# Patient Record
Sex: Female | Born: 1990 | Race: Black or African American | Hispanic: No | Marital: Single | Smoking: Former smoker
Health system: Southern US, Community
[De-identification: ages and names within clinical notes are randomized; demographics above are authoritative.]

## PROBLEM LIST (undated history)

## (undated) ENCOUNTER — Inpatient Hospital Stay: Payer: Self-pay

## (undated) DIAGNOSIS — F32A Depression, unspecified: Secondary | ICD-10-CM

## (undated) DIAGNOSIS — F329 Major depressive disorder, single episode, unspecified: Secondary | ICD-10-CM

---

## 2017-09-15 ENCOUNTER — Other Ambulatory Visit: Payer: Self-pay | Admitting: Family Medicine

## 2017-09-15 DIAGNOSIS — Z3490 Encounter for supervision of normal pregnancy, unspecified, unspecified trimester: Secondary | ICD-10-CM

## 2017-09-21 ENCOUNTER — Observation Stay
Admission: EM | Admit: 2017-09-21 | Discharge: 2017-09-21 | Disposition: A | Discharge status: Home / Self Care | Payer: Medicaid Other | Attending: Obstetrics and Gynecology | Admitting: Obstetrics and Gynecology

## 2017-09-21 DIAGNOSIS — Z3A27 27 weeks gestation of pregnancy: Secondary | ICD-10-CM | POA: Diagnosis not present

## 2017-09-21 DIAGNOSIS — Z87891 Personal history of nicotine dependence: Secondary | ICD-10-CM | POA: Insufficient documentation

## 2017-09-21 DIAGNOSIS — Z3490 Encounter for supervision of normal pregnancy, unspecified, unspecified trimester: Secondary | ICD-10-CM

## 2017-09-21 DIAGNOSIS — R109 Unspecified abdominal pain: Secondary | ICD-10-CM | POA: Diagnosis not present

## 2017-09-21 DIAGNOSIS — O26892 Other specified pregnancy related conditions, second trimester: Principal | ICD-10-CM | POA: Insufficient documentation

## 2017-09-21 LAB — CBC
HEMATOCRIT: 34.2 % — AB (ref 35.0–47.0)
HEMOGLOBIN: 11.7 g/dL — AB (ref 12.0–16.0)
MCH: 29.1 pg (ref 26.0–34.0)
MCHC: 34.3 g/dL (ref 32.0–36.0)
MCV: 84.8 fL (ref 80.0–100.0)
Platelets: 298 10*3/uL (ref 150–440)
RBC: 4.03 MIL/uL (ref 3.80–5.20)
RDW: 13.1 % (ref 11.5–14.5)
WBC: 10.2 10*3/uL (ref 3.6–11.0)

## 2017-09-21 LAB — COMPREHENSIVE METABOLIC PANEL
ALBUMIN: 3.2 g/dL — AB (ref 3.5–5.0)
ALK PHOS: 55 U/L (ref 38–126)
ALT: 12 U/L — AB (ref 14–54)
AST: 17 U/L (ref 15–41)
Anion gap: 8 (ref 5–15)
BILIRUBIN TOTAL: 0.9 mg/dL (ref 0.3–1.2)
BUN: 8 mg/dL (ref 6–20)
CO2: 24 mmol/L (ref 22–32)
CREATININE: 0.59 mg/dL (ref 0.44–1.00)
Calcium: 9 mg/dL (ref 8.9–10.3)
Chloride: 104 mmol/L (ref 101–111)
GFR calc Af Amer: >60 mL/min (ref >60)
GFR calc non Af Amer: >60 mL/min (ref >60)
GLUCOSE: 59 mg/dL — AB (ref 65–99)
POTASSIUM: 3.8 mmol/L (ref 3.5–5.1)
Sodium: 136 mmol/L (ref 135–145)
TOTAL PROTEIN: 7.1 g/dL (ref 6.5–8.1)

## 2017-09-21 MED ORDER — ACETAMINOPHEN 500 MG PO TABS: 500.0000 mg | ORAL_TABLET | Freq: Four times a day (QID) | ORAL | Status: DC | PRN

## 2017-09-21 MED ORDER — ACETAMINOPHEN 500 MG PO TABS
ORAL_TABLET | ORAL | Status: AC
  Administered 2017-09-21: 1000 mg
  Filled 2017-09-21: qty 2

## 2017-09-21 NOTE — OB Triage Note (Signed)
Patient came in for observation for lower abdominal pain. Evaluation. Patient denies uterine contractions at this time. Patient denies leaking, denies vaginal bleeding and spotting. Patient reports + FM. Patient reports Vital signs stable and patient afebrile. FHR baseline 155 with moderate variability with accelerations 15 x 15 and no decelerations. Significant other at bedside. Will continue to monitor.

## 2017-09-21 NOTE — Discharge Summary (Signed)
Patient discharged with instructions on follow up appointment, labor precautions, Tylenol for pain management, and when to seek medical attention. Patient reports + FM at discharge. Patient ambulatory at discharge with steady gait. Patient discharged with significant other. Patient discharged in wheelchair to visitor entrance.

## 2017-09-21 NOTE — Discharge Summary (Signed)
Expand All Collapse All       [] Hide copied text  [] Hover for details   Patient ID: Monica Combs, female   DOB: 01/03/1991, 26 y.o.   MRN: [id number]  Subjective   Monica Combs is a 26 y.o. female. She is at 3573w6d gestation. Patient's last menstrual period was 03/10/2017. Estimated Date of Delivery: 12/15/17  [facility] care [facility] Chief complaint:abd pain for the last 5 days  Constant . No N/V D or fever . No LOF or vag bleeding . S/P LTCS for fetal hrt abnormality .  Poor prenatal care with [facility] . Poor nutrition .   Location: Onset/timing: Duration: Quality:  Severity: Aggravating or alleviating conditions: Associated signs/symptoms: Context:  + good fetal movt   Maternal Medical History:  History reviewed. No pertinent past medical history.  PSHX : prior c/s  Not on File  Prior to Admission medications   Not on File     Social History: She  reports that she has quit smoking. she has never used smokeless tobacco. She reports that she does not drink alcohol or use drugs.  Family History: family history is not on file.  no history of gyn cancers  Review of Systems: A full review of systems was performed and negative except as noted in the HPI.   cv : no chest pain   Pulm : no SOB   ABD : no dirrhea , bloating + abd pain  Gyn : no pelvic pain  M/S no weakness O:   Objective   BP (!) 106/55 (BP Location: Left Arm)   Pulse 78   Temp 97.6 F (36.4 C) (Oral)   Resp 18   Ht 5\' 2"  (1.575 m)   Wt 59 kg (130 lb)   LMP 03/10/2017   BMI 23.78 kg/m       Results for orders placed or performed during the hospital encounter of 09/21/17 (from the past 48 hour(s))  CBC   Collection Time: 09/21/17  8:54 PM  Result Value Ref Range   WBC 10.2 3.6 - 11.0 K/uL   RBC 4.03 3.80 - 5.20 MIL/uL   Hemoglobin 11.7 (L) 12.0 - 16.0 g/dL   HCT 40.934.2 (L) 81.135.0 - 91.447.0 %   MCV 84.8 80.0 - 100.0 fL   MCH 29.1 26.0 - 34.0 pg    MCHC 34.3 32.0 - 36.0 g/dL   RDW 78.213.1 95.611.5 - 21.314.5 %   Platelets 298 150 - 440 K/uL  Comprehensive metabolic panel   Collection Time: 09/21/17  8:54 PM  Result Value Ref Range   Sodium 136 135 - 145 mmol/L   Potassium 3.8 3.5 - 5.1 mmol/L   Chloride 104 101 - 111 mmol/L   CO2 24 22 - 32 mmol/L   Glucose, Bld 59 (L) 65 - 99 mg/dL   BUN 8 6 - 20 mg/dL   Creatinine, Ser 0.860.59 0.44 - 1.00 mg/dL   Calcium 9.0 8.9 - 57.810.3 mg/dL   Total Protein 7.1 6.5 - 8.1 g/dL   Albumin 3.2 (L) 3.5 - 5.0 g/dL   AST 17 15 - 41 U/L   ALT 12 (L) 14 - 54 U/L   Alkaline Phosphatase 55 38 - 126 U/L   Total Bilirubin 0.9 0.3 - 1.2 mg/dL   GFR calc non Af Amer >60 >60 mL/min   GFR calc Af Amer >60 >60 mL/min   Anion gap 8 5 - 15     Constitutional: NAD, AAOx3  HE/ENT: extraocular movements grossly intact, moist mucous membranes CV: RRR PULM: nl respiratory effort, CTABL                                         Abd: gravid, non-tender, non-distended, soft . No rebound TTP                                            Ext: Non-tender, Nonedmeatous                     Psych: mood appropriate, speech normal Pelviccx long and closed . No blood  NST:  Baseline: 150 Variability: moderate Accelerations present x >2 10x10 Decelerations absent Time 90 min      A/P: 26 y.o. 7267w6d here for antenatal surveillance for abdominal pain . Benign abd exam Labor: not present.    poor PNC   Poor nutrition   Fetal Wellbeing: Reassuring Cat 1 tracing.  Reactive NST   D/c home stable, precautions reviewed, follow-up as scheduled.    fetal kick count instructions given   ----- Monica Combs Monica Combs , MD Attending Obstetrician and Gynecologist [facility], Department of OB/GYN [facility]           Electronically signed by Neidy Guerrieri, Ihor Austinhomas J, MD at 09/21/2017 10:37 PM     ED to Hosp-Admission (Current) on 09/21/2017        Detailed Report

## 2017-09-21 NOTE — Progress Notes (Signed)
Patient ID: Monica Combs, female   DOB: 1991/04/27, 26 y.o.   MRN: [id number]  Monica Combs is a 26 y.o. female. She is at 8334w6d gestation. Patient's last menstrual period was 03/10/2017. Estimated Date of Delivery: 12/15/17  [facility] Chief complaint:abd pain for the last 5 days  Constant . No N/V D or fever . No LOF or vag bleeding . S/P LTCS for fetal hrt abnormality .  Poor prenatal care with [facility] . Poor nutrition .   Location: Onset/timing: Duration: Quality:  Severity: Aggravating or alleviating conditions: Associated signs/symptoms: Context:  + good fetal movt   Maternal Medical History:  History reviewed. No pertinent past medical history.  PSHX : prior c/s  Not on File  Prior to Admission medications   Not on File     Social History: She  reports that she has quit smoking. she has never used smokeless tobacco. She reports that she does not drink alcohol or use drugs.  Family History: family history is not on file.  no history of gyn cancers  Review of Systems: A full review of systems was performed and negative except as noted in the HPI.   cv : no chest pain   Pulm : no SOB   ABD : no dirrhea , bloating + abd pain  Gyn : no pelvic pain  M/S no weakness O:  BP (!) 106/55 (BP Location: Left Arm)   Pulse 78   Temp 97.6 F (36.4 C) (Oral)   Resp 18   Ht 5\' 2"  (1.575 m)   Wt 59 kg (130 lb)   LMP 03/10/2017   BMI 23.78 kg/m  Results for orders placed or performed during the hospital encounter of 09/21/17 (from the past 48 hour(s))  CBC   Collection Time: 09/21/17  8:54 PM  Result Value Ref Range   WBC 10.2 3.6 - 11.0 K/uL   RBC 4.03 3.80 - 5.20 MIL/uL   Hemoglobin 11.7 (L) 12.0 - 16.0 g/dL   HCT 91.434.2 (L) 78.235.0 - 95.647.0 %   MCV 84.8 80.0 - 100.0 fL   MCH 29.1 26.0 - 34.0 pg   MCHC 34.3 32.0 - 36.0 g/dL   RDW 21.313.1 08.611.5 - 57.814.5 %   Platelets 298 150 - 440 K/uL  Comprehensive metabolic panel   Collection Time:  09/21/17  8:54 PM  Result Value Ref Range   Sodium 136 135 - 145 mmol/L   Potassium 3.8 3.5 - 5.1 mmol/L   Chloride 104 101 - 111 mmol/L   CO2 24 22 - 32 mmol/L   Glucose, Bld 59 (L) 65 - 99 mg/dL   BUN 8 6 - 20 mg/dL   Creatinine, Ser 4.690.59 0.44 - 1.00 mg/dL   Calcium 9.0 8.9 - 62.910.3 mg/dL   Total Protein 7.1 6.5 - 8.1 g/dL   Albumin 3.2 (L) 3.5 - 5.0 g/dL   AST 17 15 - 41 U/L   ALT 12 (L) 14 - 54 U/L   Alkaline Phosphatase 55 38 - 126 U/L   Total Bilirubin 0.9 0.3 - 1.2 mg/dL   GFR calc non Af Amer >60 >60 mL/min   GFR calc Af Amer >60 >60 mL/min   Anion gap 8 5 - 15     Constitutional: NAD, AAOx3  HE/ENT: extraocular movements grossly intact, moist mucous membranes CV: RRR PULM: nl respiratory effort, CTABL     Abd: gravid, non-tender, non-distended, soft . No rebound TTP  Ext: Non-tender, Nonedmeatous   Psych: mood appropriate, speech normal Pelviccx long and closed . No blood  NST:  Baseline: 150 Variability: moderate Accelerations present x >2 10x10 Decelerations absent Time 90 min      A/P: 26 y.o. 125w6d here for antenatal surveillance for abdominal pain . Benign abd exam Labor: not present.    poor PNC   Poor nutrition   Fetal Wellbeing: Reassuring Cat 1 tracing.  Reactive NST   D/c home stable, precautions reviewed, follow-up as scheduled.    fetal kick count instructions given   ----- Jennell Cornerhomas Schermerhorn , MD Attending Obstetrician and Gynecologist [facility], Department of OB/GYN [facility]

## 2017-09-22 ENCOUNTER — Encounter: Admission: RE | Admit: 2017-09-22 | Payer: Medicaid Other | Source: Ambulatory Visit

## 2017-09-22 LAB — URINE DRUG SCREEN, QUALITATIVE (ARMC ONLY)
AMPHETAMINES, UR SCREEN: NOT DETECTED
BARBITURATES, UR SCREEN: NOT DETECTED
Benzodiazepine, Ur Scrn: NOT DETECTED
CANNABINOID 50 NG, UR ~~LOC~~: POSITIVE — AB
Cocaine Metabolite,Ur ~~LOC~~: NOT DETECTED
MDMA (Ecstasy)Ur Screen: NOT DETECTED
Methadone Scn, Ur: NOT DETECTED
OPIATE, UR SCREEN: NOT DETECTED
PHENCYCLIDINE (PCP) UR S: NOT DETECTED
Tricyclic, Ur Screen: NOT DETECTED

## 2017-09-22 LAB — URINALYSIS, COMPLETE (UACMP) WITH MICROSCOPIC
BILIRUBIN URINE: NEGATIVE
Bacteria, UA: NONE SEEN
GLUCOSE, UA: NEGATIVE mg/dL
HGB URINE DIPSTICK: NEGATIVE
KETONES UR: NEGATIVE mg/dL
LEUKOCYTES UA: NEGATIVE
Nitrite: NEGATIVE
PH: 7 (ref 5.0–8.0)
PROTEIN: NEGATIVE mg/dL
RBC / HPF: NONE SEEN RBC/hpf (ref 0–5)
Specific Gravity, Urine: 1.014 (ref 1.005–1.030)

## 2017-09-23 ENCOUNTER — Ambulatory Visit: Payer: Medicaid Other

## 2017-09-24 ENCOUNTER — Ambulatory Visit
Admission: RE | Admit: 2017-09-24 | Discharge: 2017-09-24 | Disposition: A | Discharge status: Home / Self Care | Payer: Medicaid Other | Source: Ambulatory Visit | Attending: Family Medicine | Admitting: Family Medicine

## 2017-09-24 DIAGNOSIS — Z3689 Encounter for other specified antenatal screening: Secondary | ICD-10-CM | POA: Diagnosis present

## 2017-09-24 DIAGNOSIS — Z3490 Encounter for supervision of normal pregnancy, unspecified, unspecified trimester: Secondary | ICD-10-CM

## 2017-10-22 ENCOUNTER — Encounter: Payer: Self-pay | Admitting: *Deleted

## 2017-10-22 ENCOUNTER — Observation Stay
Admission: EM | Admit: 2017-10-22 | Discharge: 2017-10-22 | Disposition: A | Discharge status: Home / Self Care | Payer: Medicaid Other | Attending: Obstetrics & Gynecology | Admitting: Obstetrics & Gynecology

## 2017-10-22 ENCOUNTER — Inpatient Hospital Stay: Payer: Medicaid Other

## 2017-10-22 DIAGNOSIS — R109 Unspecified abdominal pain: Secondary | ICD-10-CM

## 2017-10-22 DIAGNOSIS — O99343 Other mental disorders complicating pregnancy, third trimester: Secondary | ICD-10-CM | POA: Insufficient documentation

## 2017-10-22 DIAGNOSIS — F329 Major depressive disorder, single episode, unspecified: Secondary | ICD-10-CM | POA: Insufficient documentation

## 2017-10-22 DIAGNOSIS — Z87891 Personal history of nicotine dependence: Secondary | ICD-10-CM | POA: Insufficient documentation

## 2017-10-22 DIAGNOSIS — Z3A32 32 weeks gestation of pregnancy: Secondary | ICD-10-CM | POA: Diagnosis not present

## 2017-10-22 DIAGNOSIS — O26893 Other specified pregnancy related conditions, third trimester: Principal | ICD-10-CM | POA: Insufficient documentation

## 2017-10-22 DIAGNOSIS — Z3490 Encounter for supervision of normal pregnancy, unspecified, unspecified trimester: Secondary | ICD-10-CM

## 2017-10-22 HISTORY — DX: Depression, unspecified: F32.A

## 2017-10-22 HISTORY — DX: Major depressive disorder, single episode, unspecified: F32.9
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
## 2017-10-22 NOTE — Discharge Instructions (Signed)
Follow up with next appt at [facility] on 10/28/17  Return if pain gets worse, if you have leaking of fluid or vaginal bleeding or decreased fetal movement
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
## 2017-10-22 NOTE — OB Triage Note (Signed)
Pt arrived from home with complaints of abdominal pain that is constant for the last 3 days. Pt denies any leaking of fluid or vaginal bleeding. Pt reports positive fetal movement. Pt received PNC at [facility]

## 2017-11-13 ENCOUNTER — Other Ambulatory Visit: Payer: Self-pay

## 2017-11-13 ENCOUNTER — Observation Stay
Admission: EM | Admit: 2017-11-13 | Discharge: 2017-11-13 | Disposition: A | Discharge status: Home / Self Care | Payer: Medicaid Other | Attending: Obstetrics & Gynecology | Admitting: Obstetrics & Gynecology

## 2017-11-13 DIAGNOSIS — R112 Nausea with vomiting, unspecified: Secondary | ICD-10-CM | POA: Diagnosis present

## 2017-11-13 DIAGNOSIS — Z79899 Other long term (current) drug therapy: Secondary | ICD-10-CM | POA: Insufficient documentation

## 2017-11-13 DIAGNOSIS — F329 Major depressive disorder, single episode, unspecified: Secondary | ICD-10-CM | POA: Diagnosis not present

## 2017-11-13 DIAGNOSIS — R1110 Vomiting, unspecified: Secondary | ICD-10-CM | POA: Diagnosis present

## 2017-11-13 DIAGNOSIS — Z87891 Personal history of nicotine dependence: Secondary | ICD-10-CM | POA: Insufficient documentation

## 2017-11-13 MED ORDER — LACTATED RINGERS IV SOLN
INTRAVENOUS | Status: DC
  Administered 2017-11-13: 05:00:00 via INTRAVENOUS
  Administered 2017-11-13: 1000 mL via INTRAVENOUS

## 2017-11-13 MED ORDER — METOCLOPRAMIDE HCL 5 MG/ML IJ SOLN
10.0000 mg | INTRAMUSCULAR | Status: DC | PRN
  Filled 2017-11-13: qty 2

## 2017-11-13 MED ORDER — LACTATED RINGERS IV BOLUS (SEPSIS)
500.0000 mL | Freq: Once | INTRAVENOUS | Status: AC
  Administered 2017-11-13: 500 mL via INTRAVENOUS

## 2017-11-13 MED ORDER — PROMETHAZINE HCL 25 MG/ML IJ SOLN
12.5000 mg | Freq: Four times a day (QID) | INTRAMUSCULAR | Status: DC | PRN
  Administered 2017-11-13: 12.5 mg via INTRAVENOUS

## 2017-11-13 MED ORDER — ONDANSETRON HCL 4 MG/2ML IJ SOLN: 4.0000 mg | Freq: Three times a day (TID) | INTRAMUSCULAR | Status: DC | PRN

## 2017-11-13 MED ORDER — PROMETHAZINE HCL 25 MG/ML IJ SOLN
INTRAMUSCULAR | Status: AC
  Administered 2017-11-13: 12.5 mg via INTRAVENOUS
  Filled 2017-11-13: qty 1

## 2017-11-13 NOTE — Discharge Summary (Signed)
Monica Combs is a 27 y.o. female. She is at 1187w3d gestation. Patient's last menstrual period was 03/10/2017. Estimated Date of Delivery: 12/15/17  [facility]: [facility]  Chief complaint: persistent nausea and vomiting  Location: GI system Onset/timing: midnight Duration: 3 hours Quality:  Nausea vomiting Severity: moderate Aggravating or alleviating conditions:nothing has made better or worse Associated signs/symptoms: no fever, chills, diarrhea, no VB.no LOF, Active fetal movement. Occasional contractions Context: nausea has been present a little longer but vomiting started at midnight and could not stop.  She noted some blood, and occasional contractions.  No known sick contacts, no diarrhea.  Had a preterm cesarean delivery last @ 36wks.    Maternal Medical History:   Past Medical History:  Diagnosis Date  . Depression    hx of depression    Past Surgical History:  Procedure Laterality Date  . CESAREAN SECTION     x1    Allergies  Allergen Reactions  . Other     Polyester - causes hives    Prior to Admission medications   Medication Sig Start Date End Date Taking? Authorizing Provider  Prenatal Vit-Fe Fumarate-FA (PRENATAL MULTIVITAMIN) TABS tablet Take 1 tablet by mouth daily at 12 noon.   Yes [provider]     Social History: She  reports that she has quit smoking. she has never used smokeless tobacco. She reports that she does not drink alcohol or use drugs.  Family History: no history of gyn cancers  Review of Systems: A full review of systems was performed and negative except as noted in the HPI.     O:  BP 105/61   Pulse 78   Temp 98 F (36.7 C) (Oral)   Resp 16   Ht 5\' 3"  (1.6 m)   Wt 68 kg (150 lb)   LMP 03/10/2017   BMI 26.57 kg/m  No results found for this or any previous visit (from the past 48 hour(s)).   Constitutional: NAD, AAOx3  HE/ENT: extraocular movements grossly intact, moist mucous membranes CV: RRR PULM:  nl respiratory effort, CTABL     Abd: gravid, non-tender, non-distended, soft      Ext: Non-tender, Nonedmeatous   Psych: mood appropriate, speech normal Pelvic fingertip, long, high  NST:  Baseline: 140 Variability: moderate Accelerations present x >2 Decelerations absent Time 20mins  Toco 2-4 on arrival, with only irritability at time of discharge   A/P: [age] y.o. 6087w3d here for nausea and vomiting  Labor: not present.   Fetal Wellbeing: Reassuring Cat 1 tracing.  Reactive NST   Phenergan given to patient, after which she slept well, and upon waking she had a breakfast and tolerated it well.  D/c home stable, precautions reviewed, follow-up as scheduled.   ----- Ranae Plumberhelsea Ward, MD Attending Obstetrician and Gynecologist [facility], Department of OB/GYN [facility]

## 2017-11-13 NOTE — Progress Notes (Signed)
Pt returned to bed from restroom, states she ate some cracker and is keeping it down, reports still feeling painful contractions in abdomen.
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
## 2017-12-16 ENCOUNTER — Other Ambulatory Visit: Payer: Self-pay

## 2017-12-16 ENCOUNTER — Observation Stay
Admission: EM | Admit: 2017-12-16 | Discharge: 2017-12-16 | Disposition: A | Discharge status: Home / Self Care | Payer: Medicaid Other | Attending: Obstetrics and Gynecology | Admitting: Obstetrics and Gynecology

## 2017-12-16 DIAGNOSIS — O9989 Other specified diseases and conditions complicating pregnancy, childbirth and the puerperium: Secondary | ICD-10-CM | POA: Diagnosis not present

## 2017-12-16 DIAGNOSIS — R110 Nausea: Secondary | ICD-10-CM | POA: Insufficient documentation

## 2017-12-16 DIAGNOSIS — O368190 Decreased fetal movements, unspecified trimester, not applicable or unspecified: Secondary | ICD-10-CM | POA: Diagnosis present

## 2017-12-16 DIAGNOSIS — O368130 Decreased fetal movements, third trimester, not applicable or unspecified: Principal | ICD-10-CM | POA: Insufficient documentation

## 2017-12-16 DIAGNOSIS — Z3A36 36 weeks gestation of pregnancy: Secondary | ICD-10-CM | POA: Insufficient documentation

## 2017-12-16 LAB — URINE DRUG SCREEN, QUALITATIVE (ARMC ONLY)
Amphetamines, Ur Screen: NOT DETECTED
Barbiturates, Ur Screen: NOT DETECTED
Benzodiazepine, Ur Scrn: NOT DETECTED
COCAINE METABOLITE, UR ~~LOC~~: NOT DETECTED
Cannabinoid 50 Ng, Ur ~~LOC~~: NOT DETECTED
MDMA (Ecstasy)Ur Screen: NOT DETECTED
METHADONE SCREEN, URINE: NOT DETECTED
OPIATE, UR SCREEN: NOT DETECTED
Phencyclidine (PCP) Ur S: NOT DETECTED
Tricyclic, Ur Screen: NOT DETECTED

## 2017-12-16 LAB — URINALYSIS, ROUTINE W REFLEX MICROSCOPIC
Bilirubin Urine: NEGATIVE
Glucose, UA: NEGATIVE mg/dL
HGB URINE DIPSTICK: NEGATIVE
Ketones, ur: NEGATIVE mg/dL
Leukocytes, UA: NEGATIVE
Nitrite: NEGATIVE
PROTEIN: NEGATIVE mg/dL
Specific Gravity, Urine: 1.008 (ref 1.005–1.030)
pH: 6 (ref 5.0–8.0)

## 2017-12-16 MED ORDER — ONDANSETRON 4 MG PO TBDP
ORAL_TABLET | ORAL | Status: AC
  Administered 2017-12-16: 4 mg
  Filled 2017-12-16: qty 1

## 2017-12-16 MED ORDER — ONDANSETRON 4 MG PO TBDP: 4.0000 mg | ORAL_TABLET | Freq: Once | ORAL | Status: DC

## 2017-12-16 NOTE — OB Triage Note (Signed)
Pt c/o vomiting and nausea since last night, and decreased fetal movement. Last time she threw up was last night.

## 2017-12-16 NOTE — Discharge Summary (Signed)
Patient ID: Monica LaityLatia Combs MRN: [id number] DOB/AGE: 1991-03-22 26 y.o.  Admit date: 12/16/2017 Discharge date: 12/16/2017  Admission Diagnoses: decreased fetal movement and nausea  Discharge Diagnoses: reassuring fetal status  Prenatal Procedures: NST  Consults: attending MD- Dr Feliberto GottronSchermerhorn  Significant Diagnostic Studies:  Results for orders placed or performed during the hospital encounter of 12/16/17 (from the past 168 hour(s))  Urinalysis, Routine w reflex microscopic   Collection Time: 12/16/17  1:52 PM  Result Value Ref Range   Color, Urine YELLOW (A) YELLOW   APPearance HAZY (A) CLEAR   Specific Gravity, Urine 1.008 1.005 - 1.030   pH 6.0 5.0 - 8.0   Glucose, UA NEGATIVE NEGATIVE mg/dL   Hgb urine dipstick NEGATIVE NEGATIVE   Bilirubin Urine NEGATIVE NEGATIVE   Ketones, ur NEGATIVE NEGATIVE mg/dL   Protein, ur NEGATIVE NEGATIVE mg/dL   Nitrite NEGATIVE NEGATIVE   Leukocytes, UA NEGATIVE NEGATIVE  Urine Drug Screen, Qualitative ([facility] only)   Collection Time: 12/16/17  1:52 PM  Result Value Ref Range   Tricyclic, Ur Screen NONE DETECTED NONE DETECTED   Amphetamines, Ur Screen NONE DETECTED NONE DETECTED   MDMA (Ecstasy)Ur Screen NONE DETECTED NONE DETECTED   Cocaine Metabolite,Ur [place] NONE DETECTED NONE DETECTED   Opiate, Ur Screen NONE DETECTED NONE DETECTED   Phencyclidine (PCP) Ur S NONE DETECTED NONE DETECTED   Cannabinoid 50 Ng, Ur [place] NONE DETECTED NONE DETECTED   Barbiturates, Ur Screen NONE DETECTED NONE DETECTED   Benzodiazepine, Ur Scrn NONE DETECTED NONE DETECTED   Methadone Scn, Ur NONE DETECTED NONE DETECTED    Treatments: Zofran ODT given, PO hydration.   Hospital Course:  This is a 27 y.o. G2P1001 with IUP at 5952w2d by 24wk US, Presented via EMS with c/o pelvic pressure, nausea and decreased fetal movement. On arrival she was noted to have a cervical exam of Dilation: 1 Effacement (%): 30 Cervical Position: Posterior Station: -3 Exam by:: L Neese,RN   No leaking of fluid and no bleeding.  NST reactive, irreg UCs q8-5720min.  She was observed, fetal heart rate monitoring remained reassuring, and she had no signs/symptoms of preterm labor or other maternal-fetal concerns. She was deemed stable for discharge to home with [facility] as soon as possible to discuss delivery planning.   Discharge Physical Exam:  BP 116/70 (BP Location: Left Arm)   Pulse 88   Temp 98.4 F (36.9 C) (Oral)   Resp 18   LMP 03/10/2017   General: NAD CV: RRR Pulm: CTABL, nl effort ABD: s/nd/nt, gravid DVT Evaluation: LE non-ttp, no evidence of DVT on exam.  SVE: 1/30/-2 FHT: 135bpm, + accels, mod variability, no decels.  TOCO: irregular UCs   Discharge Condition: Stable  Disposition: 01-Home or Self Care  Discharge Instructions    Fetal Kick Count:  Lie on our left side for one hour after a meal, and count the number of times your baby kicks.  If it is less than 5 times, get up, move around and drink some juice.  Repeat the test 30 minutes later.  If it is still less than 5 kicks in an hour, notify your doctor.   Complete by:  As directed    LABOR:  When conractions begin, you should start to time them from the beginning of one contraction to the beginning  of the next.  When contractions are 5 - 10 minutes apart or less and have been regular for at least an hour, you should call  your health care provider.   Complete by:  As directed    Notify physician for bleeding from the vagina   Complete by:  As directed    Notify physician for blurring of vision or spots before the eyes   Complete by:  As directed    Notify physician for chills or fever   Complete by:  As directed    Notify physician for fainting spells, "black outs" or loss of consciousness   Complete by:  As directed    Notify physician for increase in vaginal discharge   Complete by:  As directed    Notify physician for leaking of fluid   Complete by:  As  directed    Notify physician for pain or burning when urinating   Complete by:  As directed    Notify physician for pelvic pressure (sudden increase)   Complete by:  As directed    Notify physician for severe or continued nausea or vomiting   Complete by:  As directed    Notify physician for sudden gushing of fluid from the vagina (with or without continued leaking)   Complete by:  As directed    Notify physician for sudden, constant, or occasional abdominal pain   Complete by:  As directed    Notify physician if baby moving less than usual   Complete by:  As directed      Allergies as of 12/16/2017      Reactions   Other    Polyester - causes hives      Medication List    TAKE these medications   prenatal multivitamin Tabs tablet Take 1 tablet by mouth daily at 12 noon.      Follow-up Information    Center, [facility]. Go on 12/20/2017.   Specialty:  General Practice Why:  at 3:40pm Contact information: [address] [place] [address] [phone]           Signed: ----- McVey, REBECCA A, CNM 12/16/17 3:55 PM

## 2017-12-28 ENCOUNTER — Inpatient Hospital Stay
Admission: EM | Admit: 2017-12-28 | Discharge: 2017-12-31 | DRG: 788 | Disposition: A | Discharge status: Home / Self Care | Payer: Medicaid Other | Source: Ambulatory Visit | Attending: Obstetrics and Gynecology | Admitting: Obstetrics and Gynecology

## 2017-12-28 DIAGNOSIS — Z87891 Personal history of nicotine dependence: Secondary | ICD-10-CM

## 2017-12-28 DIAGNOSIS — O479 False labor, unspecified: Secondary | ICD-10-CM | POA: Diagnosis present

## 2017-12-28 DIAGNOSIS — Z9889 Other specified postprocedural states: Secondary | ICD-10-CM

## 2017-12-28 DIAGNOSIS — Z23 Encounter for immunization: Secondary | ICD-10-CM

## 2017-12-28 DIAGNOSIS — Z91040 Latex allergy status: Secondary | ICD-10-CM

## 2017-12-28 DIAGNOSIS — O99824 Streptococcus B carrier state complicating childbirth: Secondary | ICD-10-CM | POA: Diagnosis present

## 2017-12-28 DIAGNOSIS — O34211 Maternal care for low transverse scar from previous cesarean delivery: Principal | ICD-10-CM | POA: Diagnosis present

## 2017-12-28 DIAGNOSIS — Z3A38 38 weeks gestation of pregnancy: Secondary | ICD-10-CM

## 2017-12-28 NOTE — H&P (Signed)
Monica Combs is a 27 y.o. female [facility] . Pt elects for repeat LTCS  On 01/05/18.   Pregnancy issues:  Depression and h/o bipolar - no meds  + GBS . OB History    Gravida Para Term Preterm AB Living   2 1 1     1    SAB TAB Ectopic Multiple Live Births           1     Past Medical History:  Diagnosis Date  . Depression    hx of depression   Past Surgical History:  Procedure Laterality Date  . CESAREAN SECTION     x1   Family History: family history is not on file. Social History:  reports that she has quit smoking. she has never used smokeless tobacco. She reports that she does not drink alcohol or use drugs.     Maternal Diabetes: No Genetic Screening: not done Maternal Ultrasounds/Referrals: Normal Fetal Ultrasounds or other Referrals:  None Maternal Substance Abuse:  No Significant Maternal Medications:  None Significant Maternal Lab Results:  None Other Comments:  None  ROSunremarkable   History  PMHX : depression and bipolar  PSHX : cesarean section  Last menstrual period 04/06/2017. Exam : BP 109/79   wt #164 Lungs CTA   CV RRR without murmur   Adb : gravid NT  Pelvic deferred    Prenatal labs: ABO, Rh:  B+ Antibody:  neg  Rubella:  IMM/ varicella IMM RPR:   NR  HBsAg:   neg  HIV:   Neg  GBS:   Positive   Assessment/Plan: Elective repeat LTCS 01/05/18 . Risks of the procedure d/w the pt   Suzy Bouchardhomas J Yehudit Fulginiti 12/28/2017, 3:48 PM

## 2017-12-29 ENCOUNTER — Encounter
Admission: EM | Disposition: A | Discharge status: Home / Self Care | Payer: Self-pay | Source: Ambulatory Visit | Attending: Obstetrics and Gynecology

## 2017-12-29 ENCOUNTER — Inpatient Hospital Stay: Payer: Medicaid Other | Admitting: Anesthesiology

## 2017-12-29 ENCOUNTER — Other Ambulatory Visit: Payer: Self-pay

## 2017-12-29 DIAGNOSIS — O34211 Maternal care for low transverse scar from previous cesarean delivery: Secondary | ICD-10-CM | POA: Diagnosis present

## 2017-12-29 DIAGNOSIS — Z9889 Other specified postprocedural states: Secondary | ICD-10-CM

## 2017-12-29 DIAGNOSIS — O479 False labor, unspecified: Secondary | ICD-10-CM | POA: Diagnosis present

## 2017-12-29 DIAGNOSIS — O99824 Streptococcus B carrier state complicating childbirth: Secondary | ICD-10-CM | POA: Diagnosis present

## 2017-12-29 DIAGNOSIS — Z87891 Personal history of nicotine dependence: Secondary | ICD-10-CM | POA: Diagnosis not present

## 2017-12-29 DIAGNOSIS — Z3A38 38 weeks gestation of pregnancy: Secondary | ICD-10-CM | POA: Diagnosis not present

## 2017-12-29 DIAGNOSIS — Z23 Encounter for immunization: Secondary | ICD-10-CM | POA: Diagnosis not present

## 2017-12-29 DIAGNOSIS — Z91040 Latex allergy status: Secondary | ICD-10-CM | POA: Diagnosis not present

## 2017-12-29 LAB — CBC
HCT: 36.7 % (ref 35.0–47.0)
HEMATOCRIT: 33.8 % — AB (ref 35.0–47.0)
HEMOGLOBIN: 11.2 g/dL — AB (ref 12.0–16.0)
Hemoglobin: 12.1 g/dL (ref 12.0–16.0)
MCH: 27.7 pg (ref 26.0–34.0)
MCH: 27.8 pg (ref 26.0–34.0)
MCHC: 33 g/dL (ref 32.0–36.0)
MCHC: 33.1 g/dL (ref 32.0–36.0)
MCV: 83.7 fL (ref 80.0–100.0)
MCV: 84.4 fL (ref 80.0–100.0)
PLATELETS: 399 10*3/uL (ref 150–440)
Platelets: 347 10*3/uL (ref 150–440)
RBC: 4.03 MIL/uL (ref 3.80–5.20)
RBC: 4.35 MIL/uL (ref 3.80–5.20)
RDW: 14.8 % — ABNORMAL HIGH (ref 11.5–14.5)
RDW: 14.9 % — ABNORMAL HIGH (ref 11.5–14.5)
WBC: 10.7 10*3/uL (ref 3.6–11.0)
WBC: 14.7 10*3/uL — ABNORMAL HIGH (ref 3.6–11.0)

## 2017-12-29 LAB — TYPE AND SCREEN
ABO/RH(D): B POS
ANTIBODY SCREEN: NEGATIVE

## 2017-12-29 LAB — ABO/RH: ABO/RH(D): B POS

## 2017-12-29 SURGERY — Surgical Case
Anesthesia: Spinal | Site: Abdomen | Wound class: Clean Contaminated

## 2017-12-29 MED ORDER — BUPIVACAINE LIPOSOME 1.3 % IJ SUSP
20.0000 mL | Freq: Once | INTRAMUSCULAR | Status: DC
  Filled 2017-12-29: qty 20

## 2017-12-29 MED ORDER — SIMETHICONE 80 MG PO CHEW
80.0000 mg | CHEWABLE_TABLET | Freq: Three times a day (TID) | ORAL | Status: DC
  Administered 2017-12-29 – 2017-12-31 (×7): 80 mg via ORAL
  Filled 2017-12-29 (×7): qty 1

## 2017-12-29 MED ORDER — BUTORPHANOL TARTRATE 1 MG/ML IJ SOLN: 1.0000 mg | INTRAMUSCULAR | Status: DC | PRN

## 2017-12-29 MED ORDER — KETOROLAC TROMETHAMINE 30 MG/ML IJ SOLN
30.0000 mg | Freq: Four times a day (QID) | INTRAMUSCULAR | Status: AC
  Filled 2017-12-29 (×2): qty 1

## 2017-12-29 MED ORDER — LACTATED RINGERS IV SOLN
INTRAVENOUS | Status: DC
  Administered 2017-12-29: 16:00:00 via INTRAVENOUS

## 2017-12-29 MED ORDER — OXYCODONE-ACETAMINOPHEN 5-325 MG PO TABS: 2.0000 | ORAL_TABLET | ORAL | Status: DC | PRN

## 2017-12-29 MED ORDER — BUPIVACAINE LIPOSOME 1.3 % IJ SUSP
INTRAMUSCULAR | Status: DC | PRN
  Administered 2017-12-29: 88 mL

## 2017-12-29 MED ORDER — ACETAMINOPHEN 325 MG PO TABS
650.0000 mg | ORAL_TABLET | ORAL | Status: DC | PRN
  Administered 2017-12-30 – 2017-12-31 (×4): 650 mg via ORAL
  Filled 2017-12-29 (×4): qty 2

## 2017-12-29 MED ORDER — OXYTOCIN 40 UNITS IN LACTATED RINGERS INFUSION - SIMPLE MED
2.5000 [IU]/h | INTRAVENOUS | Status: AC
  Administered 2017-12-29: 2.5 [IU]/h via INTRAVENOUS

## 2017-12-29 MED ORDER — SIMETHICONE 80 MG PO CHEW: 80.0000 mg | CHEWABLE_TABLET | ORAL | Status: DC | PRN

## 2017-12-29 MED ORDER — LACTATED RINGERS IV SOLN
INTRAVENOUS | Status: DC
  Administered 2017-12-29 (×2): via INTRAVENOUS

## 2017-12-29 MED ORDER — ONDANSETRON HCL 4 MG/2ML IJ SOLN
INTRAMUSCULAR | Status: DC | PRN
  Administered 2017-12-29: 4 mg via INTRAVENOUS

## 2017-12-29 MED ORDER — ZOLPIDEM TARTRATE 5 MG PO TABS: 5.0000 mg | ORAL_TABLET | Freq: Every evening | ORAL | Status: DC | PRN

## 2017-12-29 MED ORDER — SODIUM CHLORIDE 0.9% FLUSH: 3.0000 mL | INTRAVENOUS | Status: DC | PRN

## 2017-12-29 MED ORDER — OXYCODONE HCL 5 MG PO TABS: 10.0000 mg | ORAL_TABLET | ORAL | Status: DC | PRN

## 2017-12-29 MED ORDER — DIBUCAINE 1 % RE OINT: 1.0000 "application " | TOPICAL_OINTMENT | RECTAL | Status: DC | PRN

## 2017-12-29 MED ORDER — ACETAMINOPHEN 325 MG PO TABS
650.0000 mg | ORAL_TABLET | ORAL | Status: DC | PRN
Start: 2017-12-29 — End: 2017-12-29

## 2017-12-29 MED ORDER — MORPHINE SULFATE (PF) 0.5 MG/ML IJ SOLN
INTRAMUSCULAR | Status: AC
  Filled 2017-12-29: qty 10

## 2017-12-29 MED ORDER — MENTHOL 3 MG MT LOZG
1.0000 | LOZENGE | OROMUCOSAL | Status: DC | PRN
  Filled 2017-12-29: qty 9

## 2017-12-29 MED ORDER — FENTANYL CITRATE (PF) 100 MCG/2ML IJ SOLN
INTRAMUSCULAR | Status: DC | PRN
  Administered 2017-12-29: 15 ug via INTRATHECAL

## 2017-12-29 MED ORDER — OXYTOCIN 40 UNITS IN LACTATED RINGERS INFUSION - SIMPLE MED
2.5000 [IU]/h | INTRAVENOUS | Status: DC
  Administered 2017-12-29: 199 mL via INTRAVENOUS
  Administered 2017-12-29: 1 mL via INTRAVENOUS
  Filled 2017-12-29: qty 1000

## 2017-12-29 MED ORDER — ACETAMINOPHEN 325 MG PO TABS
650.0000 mg | ORAL_TABLET | Freq: Four times a day (QID) | ORAL | Status: AC
  Administered 2017-12-29 – 2017-12-30 (×4): 650 mg via ORAL
  Filled 2017-12-29 (×4): qty 2

## 2017-12-29 MED ORDER — MORPHINE SULFATE (PF) 0.5 MG/ML IJ SOLN
INTRAMUSCULAR | Status: DC | PRN
  Administered 2017-12-29: .1 mg via INTRATHECAL

## 2017-12-29 MED ORDER — WITCH HAZEL-GLYCERIN EX PADS: 1.0000 "application " | MEDICATED_PAD | CUTANEOUS | Status: DC | PRN

## 2017-12-29 MED ORDER — LACTATED RINGERS IV SOLN: 500.0000 mL | INTRAVENOUS | Status: DC | PRN

## 2017-12-29 MED ORDER — DIPHENHYDRAMINE HCL 25 MG PO CAPS
25.0000 mg | ORAL_CAPSULE | Freq: Four times a day (QID) | ORAL | Status: DC | PRN
  Administered 2017-12-29: 25 mg via ORAL
  Filled 2017-12-29: qty 1

## 2017-12-29 MED ORDER — OXYCODONE-ACETAMINOPHEN 5-325 MG PO TABS: 1.0000 | ORAL_TABLET | ORAL | Status: DC | PRN

## 2017-12-29 MED ORDER — FENTANYL CITRATE (PF) 100 MCG/2ML IJ SOLN
INTRAMUSCULAR | Status: AC
  Filled 2017-12-29: qty 2

## 2017-12-29 MED ORDER — SODIUM CHLORIDE FLUSH 0.9 % IV SOLN
INTRAVENOUS | Status: AC
  Filled 2017-12-29: qty 50

## 2017-12-29 MED ORDER — PROPOFOL 10 MG/ML IV BOLUS
INTRAVENOUS | Status: AC
  Filled 2017-12-29: qty 20

## 2017-12-29 MED ORDER — PRENATAL MULTIVITAMIN CH
1.0000 | ORAL_TABLET | Freq: Every day | ORAL | Status: DC
  Administered 2017-12-29 – 2017-12-31 (×3): 1 via ORAL
  Filled 2017-12-29 (×3): qty 1

## 2017-12-29 MED ORDER — OXYTOCIN BOLUS FROM INFUSION: 500.0000 mL | Freq: Once | INTRAVENOUS | Status: DC

## 2017-12-29 MED ORDER — NALBUPHINE HCL 10 MG/ML IJ SOLN: 5.0000 mg | INTRAMUSCULAR | Status: DC | PRN

## 2017-12-29 MED ORDER — COCONUT OIL OIL
1.0000 "application " | TOPICAL_OIL | Status: DC | PRN
  Filled 2017-12-29: qty 120

## 2017-12-29 MED ORDER — IBUPROFEN 600 MG PO TABS
600.0000 mg | ORAL_TABLET | Freq: Four times a day (QID) | ORAL | Status: DC
  Administered 2017-12-30 – 2017-12-31 (×7): 600 mg via ORAL
  Filled 2017-12-29 (×7): qty 1

## 2017-12-29 MED ORDER — MEPERIDINE HCL 25 MG/ML IJ SOLN: 6.2500 mg | INTRAMUSCULAR | Status: DC | PRN

## 2017-12-29 MED ORDER — LACTATED RINGERS IV SOLN
INTRAVENOUS | Status: DC | PRN
  Administered 2017-12-29: 03:00:00 via INTRAVENOUS

## 2017-12-29 MED ORDER — SIMETHICONE 80 MG PO CHEW
80.0000 mg | CHEWABLE_TABLET | ORAL | Status: DC
  Administered 2017-12-30 (×2): 80 mg via ORAL
  Filled 2017-12-29 (×2): qty 1

## 2017-12-29 MED ORDER — DIPHENHYDRAMINE HCL 25 MG PO CAPS: 25.0000 mg | ORAL_CAPSULE | ORAL | Status: DC | PRN

## 2017-12-29 MED ORDER — OXYCODONE HCL 5 MG PO TABS
5.0000 mg | ORAL_TABLET | ORAL | Status: DC | PRN
  Administered 2017-12-30 – 2017-12-31 (×3): 5 mg via ORAL
  Filled 2017-12-29 (×3): qty 1

## 2017-12-29 MED ORDER — TETANUS-DIPHTH-ACELL PERTUSSIS 5-2.5-18.5 LF-MCG/0.5 IM SUSP: 0.5000 mL | Freq: Once | INTRAMUSCULAR | Status: DC

## 2017-12-29 MED ORDER — ONDANSETRON HCL 4 MG/2ML IJ SOLN: 4.0000 mg | Freq: Four times a day (QID) | INTRAMUSCULAR | Status: DC | PRN

## 2017-12-29 MED ORDER — ONDANSETRON HCL 4 MG/2ML IJ SOLN
INTRAMUSCULAR | Status: AC
Start: 2017-12-29 — End: 2017-12-29
  Filled 2017-12-29: qty 2

## 2017-12-29 MED ORDER — LIDOCAINE HCL (PF) 1 % IJ SOLN: 30.0000 mL | INTRAMUSCULAR | Status: DC | PRN

## 2017-12-29 MED ORDER — SOD CITRATE-CITRIC ACID 500-334 MG/5ML PO SOLN
30.0000 mL | ORAL | Status: DC | PRN
  Administered 2017-12-29: 30 mL via ORAL
  Filled 2017-12-29: qty 15

## 2017-12-29 MED ORDER — BUPIVACAINE IN DEXTROSE 0.75-8.25 % IT SOLN
INTRATHECAL | Status: DC | PRN
  Administered 2017-12-29: 1.6 mL via INTRATHECAL

## 2017-12-29 MED ORDER — SODIUM CHLORIDE 0.9 % IV SOLN
INTRAVENOUS | Status: DC | PRN
  Administered 2017-12-29: 50 ug/min via INTRAVENOUS

## 2017-12-29 MED ORDER — NALBUPHINE HCL 10 MG/ML IJ SOLN: 5.0000 mg | Freq: Once | INTRAMUSCULAR | Status: DC | PRN

## 2017-12-29 MED ORDER — SENNOSIDES-DOCUSATE SODIUM 8.6-50 MG PO TABS
2.0000 | ORAL_TABLET | ORAL | Status: DC
  Administered 2017-12-30 (×2): 2 via ORAL
  Filled 2017-12-29 (×2): qty 2

## 2017-12-29 MED ORDER — KETOROLAC TROMETHAMINE 30 MG/ML IJ SOLN
30.0000 mg | Freq: Four times a day (QID) | INTRAMUSCULAR | Status: AC
  Administered 2017-12-29 (×3): 30 mg via INTRAVENOUS
  Filled 2017-12-29: qty 1

## 2017-12-29 MED ORDER — ONDANSETRON HCL 4 MG/2ML IJ SOLN: 4.0000 mg | Freq: Three times a day (TID) | INTRAMUSCULAR | Status: DC | PRN

## 2017-12-29 MED ORDER — CEFAZOLIN SODIUM-DEXTROSE 2-4 GM/100ML-% IV SOLN
2.0000 g | Freq: Once | INTRAVENOUS | Status: AC
  Administered 2017-12-29: 2 g via INTRAVENOUS
  Filled 2017-12-29: qty 100

## 2017-12-29 MED ORDER — NALOXONE HCL 0.4 MG/ML IJ SOLN: 0.4000 mg | INTRAMUSCULAR | Status: DC | PRN

## 2017-12-29 MED ORDER — DIPHENHYDRAMINE HCL 50 MG/ML IJ SOLN: 12.5000 mg | INTRAMUSCULAR | Status: DC | PRN

## 2017-12-29 SURGICAL SUPPLY — 27 items
BARRIER ADH SEPRAFILM 3INX5IN (MISCELLANEOUS) IMPLANT
BARRIER ADHS 3X4 INTERCEED (GAUZE/BANDAGES/DRESSINGS) ×3 IMPLANT
CANISTER SUCT 3000ML PPV (MISCELLANEOUS) ×3 IMPLANT
CHLORAPREP W/TINT 26ML (MISCELLANEOUS) ×6 IMPLANT
DRSG TELFA 3X8 NADH (GAUZE/BANDAGES/DRESSINGS) IMPLANT
DRSG TELFA 4X3 1S NADH ST (GAUZE/BANDAGES/DRESSINGS) ×3 IMPLANT
ELECT CAUTERY BLADE 6.4 (BLADE) ×3 IMPLANT
ELECT REM PT RETURN 9FT ADLT (ELECTROSURGICAL) ×3
ELECTRODE REM PT RTRN 9FT ADLT (ELECTROSURGICAL) ×1 IMPLANT
GAUZE SPONGE 4X4 12PLY STRL (GAUZE/BANDAGES/DRESSINGS) ×3 IMPLANT
GLOVE BIO SURGEON STRL SZ8 (GLOVE) ×27 IMPLANT
GOWN STRL REUS W/ TWL LRG LVL3 (GOWN DISPOSABLE) ×3 IMPLANT
GOWN STRL REUS W/ TWL XL LVL3 (GOWN DISPOSABLE) ×1 IMPLANT
GOWN STRL REUS W/TWL LRG LVL3 (GOWN DISPOSABLE) ×6
GOWN STRL REUS W/TWL XL LVL3 (GOWN DISPOSABLE) ×2
NEEDLE HYPO 22GX1.5 SAFETY (NEEDLE) ×3 IMPLANT
NS IRRIG 1000ML POUR BTL (IV SOLUTION) ×3 IMPLANT
PACK C SECTION AR (MISCELLANEOUS) ×3 IMPLANT
PAD OB MATERNITY 4.3X12.25 (PERSONAL CARE ITEMS) ×6 IMPLANT
PAD PREP 24X41 OB/GYN DISP (PERSONAL CARE ITEMS) ×3 IMPLANT
STAPLER INSORB 30 2030 C-SECTI (MISCELLANEOUS) ×3 IMPLANT
STRAP SAFETY 5IN WIDE (MISCELLANEOUS) ×3 IMPLANT
SUCT VACUUM KIWI BELL (SUCTIONS) ×3 IMPLANT
SUT CHROMIC 1 CTX 36 (SUTURE) ×9 IMPLANT
SUT PLAIN GUT 0 (SUTURE) ×6 IMPLANT
SUT VIC AB 0 CT1 36 (SUTURE) ×6 IMPLANT
SYR 30ML LL (SYRINGE) ×6 IMPLANT

## 2017-12-29 NOTE — Anesthesia Preprocedure Evaluation (Addendum)
Anesthesia Evaluation  Patient identified by MRN, date of birth, ID band Patient awake    Reviewed: Allergy & Precautions, NPO status , Patient's Chart, lab work & pertinent test results  History of Anesthesia Complications Negative for: history of anesthetic complications  Airway Mallampati: II  TM Distance: >3 FB Neck ROM: Full    Dental no notable dental hx.    Pulmonary neg sleep apnea, neg COPD, former smoker,    breath sounds clear to auscultation- rhonchi (-) wheezing      Cardiovascular Exercise Tolerance: Good (-) hypertension(-) CAD, (-) Past MI, (-) Cardiac Stents and (-) CABG  Rhythm:Regular Rate:Normal - Systolic murmurs and - Diastolic murmurs    Neuro/Psych PSYCHIATRIC DISORDERS Depression negative neurological ROS     GI/Hepatic negative GI ROS, Neg liver ROS,   Endo/Other  negative endocrine ROSneg diabetes  Renal/GU negative Renal ROS     Musculoskeletal negative musculoskeletal ROS (+)   Abdominal (+) - obese, Gravid abdomen  Peds  Hematology negative hematology ROS (+)   Anesthesia Other Findings Past Medical History: No date: Depression     Comment:  hx of depression  1 prior CS for fetal distress  Reproductive/Obstetrics (+) Pregnancy                             Lab Results  Component Value Date   WBC 10.7 12/29/2017   HGB 12.1 12/29/2017   HCT 36.7 12/29/2017   MCV 84.4 12/29/2017   PLT 399 12/29/2017    Anesthesia Physical Anesthesia Plan  ASA: II  Anesthesia Plan: Spinal   Post-op Pain Management:    Induction:   PONV Risk Score and Plan: 2 and Ondansetron  Airway Management Planned: Natural Airway  Additional Equipment:   Intra-op Plan:   Post-operative Plan:   Informed Consent: I have reviewed the patients History and Physical, chart, labs and discussed the procedure including the risks, benefits and alternatives for the proposed  anesthesia with the patient or authorized representative who has indicated his/her understanding and acceptance.   Dental advisory given  Plan Discussed with: CRNA and Anesthesiologist  Anesthesia Plan Comments:         Anesthesia Quick Evaluation

## 2017-12-29 NOTE — Progress Notes (Signed)
Pt sitting upright in bed, feeling nauseated and then throwing up in emebag. Upright in bed to rinse mouth.

## 2017-12-29 NOTE — Op Note (Signed)
NAME:  Monica Combs, Monica Combs NO.:  [id number]  MEDICAL RECORD NO.:  [id number]  LOCATION:                                 FACILITY:  PHYSICIAN:  Jennell Corner, MDDATE OF BIRTH:  Nov 12, 1990  DATE OF PROCEDURE:  12/29/2017 DATE OF DISCHARGE:                              OPERATIVE REPORT   PREOPERATIVE DIAGNOSIS: 1. 26 plus 1 week estimated gestational age. 2. Active labor. 3. Previous cesarean section. 4. Elects for repeat cesarean section.  POSTOPERATIVE DIAGNOSIS: 1. 26 plus 1 week estimated gestational age. 2. Active labor. 3. Previous cesarean section. 4. Elects for repeat cesarean section.  PROCEDURE PERFORMED:  Repeat low transverse cesarean section.  SURGEON:  Jennell Corner, MD  ANESTHESIA:  Spinal.  FIRST ASSISTANT:  Genia Del, certified nurse midwife.  INDICATION:  This is a 27 year old gravida 2, para 1 patient at 53 plus 1 week estimated gestational age, presents to Labor and Delivery in active labor, with advanced cervical dilation of 4.5 cm.  The patient was scheduled for cesarean section on January 04, 2018, given that she had a prior cesarean section and elects for repeat cesarean section.  DESCRIPTION OF PROCEDURE:  After adequate spinal anesthesia, the patient was placed in dorsal supine position, hip rolled on the right side.  The patient's abdomen was prepped and draped in normal sterile fashion.  The patient did receive 2 g IV Ancef prior to commencement of the case. Time-out was performed.  A Pfannenstiel skin incision was made 2 fingerbreadths above the symphysis pubis.  Sharp dissection was used to identify the fascia.  Fascia was opened in the midline and opened in a transverse fashion.  The superior aspect of the fascia was grasped with Kocher clamps and the recti muscles dissected free.  Inferior aspect of the fascia was grasped with Kocher clamps, Pyramidalis muscle was dissected free.  Entry into the peritoneal  cavity was accomplished sharply.  The vesicular uterine peritoneal fold was identified.  Bladder flap was created.  The bladder blade was placed to push the bladder inferiorly.  A low transverse uterine incision was made.  Upon entry into the endometrial cavity, clear fluid resulted.  Incision was extended with blunt transverse traction.  Fetal head that was somewhat wedged was elevated to the incision, and Kiwi vacuum was applied to the occiput.  With 1 gentle pull, the head was delivered, the vacuum was removed.  A loose nuchal cord was reduced and the fetal shoulders were then delivered, followed by the body without difficulty.  Vigorous female was dried on the patient's abdomen for 60 seconds.  Oral and nasopharynx were suctioned.  After 60 seconds, the female was passed to nursery staff who assigned Apgar scores of 8 and 9, fetal weight 6 pounds 6 ounces. The placenta was then manually delivered.  The uterus was exteriorized. The endometrial cavity was wiped clean with laparotomy tape.  Ring forceps was used to open the cervix and this was passed off the operative field.  Uterine incision was closed with a running 1 chromic suture with locking stitches.  Good hemostasis was noted.  Fallopian tubes and ovaries appeared normal.  Posterior cul-de-sac was irrigated and suctioned.  The uterus was placed back into the abdominal cavity. The pericolic gutters were wiped clean with laparotomy tape.  The uterine incision again appeared hemostatic.  Interceed was placed over the uterine incision in a T-shaped fashion and the fascia was then closed with 0 Vicryl suture in a running nonlocking fashion.  The fascial edges were then injected with a solution comprised of 20 mL of 1.3% Exparel with 30 mL of 0.5% Marcaine, and 50 mL of normal saline, 50 mL were injected in the fascial edge.  Subcutaneous tissue was then irrigated and bovied, and the skin was then reapproximated with Insorb absorbable  staples.  Additional 30 mL of Exparel and Marcaine solution was injected at the skin level.  Good hemostasis was noted.  There were no complications.  ESTIMATED BLOOD LOSS:  400 mL.  INTRAOPERATIVE FLUIDS:  600 mL.  URINE OUTPUT:  300 mL.  The patient tolerated the procedure well and was taken to recovery room in good condition.  Time of delivery:  0332 hours.          ______________________________ Jennell Cornerhomas Schermerhorn, MD     TS/MEDQ  D:  12/29/2017  T:  12/29/2017  Job:  [id number]

## 2017-12-29 NOTE — H&P (Signed)
Lennox LaityLatia Kott is a 27 y.o. female presenting for active labor  Previous c/s  Elects for repeat c/s   Declines BTL . edc 01/11/18 OB History    Gravida Para Term Preterm AB Living   2 1 1     1    SAB TAB Ectopic Multiple Live Births           1     Past Medical History:  Diagnosis Date  . Depression    hx of depression   Past Surgical History:  Procedure Laterality Date  . CESAREAN SECTION     x1   Family History: family history is not on file. Social History:  reports that she has quit smoking. she has never used smokeless tobacco. She reports that she does not drink alcohol or use drugs.     Maternal Diabetes: No Genetic Screening: Declined Maternal Ultrasounds/Referrals: Normal Fetal Ultrasounds or other Referrals:  None Maternal Substance Abuse:  No Significant Maternal Medications:  None Significant Maternal Lab Results:  None Other Comments:  None  ROS History Dilation: 4.5 Effacement (%): 70 Station: -2 Exam by:: Manual MeierN. Aten, RN Last menstrual period 04/06/2017. Exam Physical Exam   lungs CTA  CV rrr  Adb soft NT  Gravid  Prenatal labs: ABO, Rh:  B+ Antibody:  neg  Rubella:  Imm / varicella Imm  RPR:   neg  HBsAg:   neg  HIV:   neg  GBS:   positive   Assessment/Plan: Active labor   elective repeat LTCS  Risks reviewed in office previously    Ihor Austinhomas J Bannie Lobban 12/29/2017, 12:24 AM

## 2017-12-29 NOTE — Lactation Note (Signed)
This note was copied from a baby's chart. Lactation Consultation Note  Patient Name: Monica Combs ZOXWR'UToday's Date: 12/29/2017 Reason for consult: Follow-up assessment   RN states baby breastfed "well" a little while ago. Mom on phone now and distracted, but gave me permission to feed the 2 ml colostrum she pumped earlier. Once he felt colostrum in his mought, his suck improved quite a bit. He did chomp down with his gums, so I am wondering about his frenulum. Lip frenulum a bit tight. His mouth is so tight that it is hard to see below tongue right now. He has had low temps today and getting lab work, so I am try not to stress him too much right now. Will have LC F/U tomorrow. MOm to pump and feed baby any time she cannot get him to successfully breastfeed him   Maternal Data    Feeding Feeding Type: Breast Milk Length of feed: 4 min  LATCH Score Latch: Repeated attempts needed to sustain latch, nipple held in mouth throughout feeding, stimulation needed to elicit sucking reflex.  Audible Swallowing: None  Type of Nipple: Everted at rest and after stimulation  Comfort (Breast/Nipple): Soft / non-tender  Hold (Positioning): Full assist, staff holds infant at breast  LATCH Score: 5  Interventions Interventions: Skin to skin  Lactation Tools Discussed/Used     Consult Status      Sunday CornSandra Clark Levada Bowersox 12/29/2017, 4:44 PM

## 2017-12-29 NOTE — Anesthesia Post-op Follow-up Note (Signed)
  Anesthesia Pain Follow-up Note  Patient: Monica Combs  Day #: 1  Date of Follow-up: 12/29/2017 Time: 7:21 AM  Last Vitals:  Vitals:   12/29/17 0619 12/29/17 0624  BP: (!) 165/81 136/86  Pulse: 90 83  Resp: 18   Temp: 36.7 C   SpO2: 100%     Level of Consciousness: alert  Pain: mild   Side Effects:None  Catheter Site Exam:clean, dry     Plan: D/C from anesthesia care at surgeon's request  Clydene PughBeane, Natonya Finstad D

## 2017-12-29 NOTE — Plan of Care (Signed)
Pt laboring, repeat cesearean section performed without incident, delivery at 0332, nuchal x1, infant, delayed cord clamp, NNP and transition RN present. Infant dried and stimulated, suction bulb, crying, apgar 8/9.

## 2017-12-29 NOTE — Anesthesia Postprocedure Evaluation (Signed)
Anesthesia Post Note  Patient: Monica Combs  Procedure(s) Performed: CESAREAN SECTION (N/A Abdomen)  Patient location during evaluation: Mother Baby Anesthesia Type: Spinal Level of consciousness: awake and alert and oriented Pain management: satisfactory to patient Vital Signs Assessment: post-procedure vital signs reviewed and stable Respiratory status: respiratory function stable Cardiovascular status: stable Postop Assessment: no backache, no headache, spinal receding, no apparent nausea or vomiting, patient able to bend at knees and adequate PO intake Anesthetic complications: no     Last Vitals:  Vitals:   12/29/17 0619 12/29/17 0624  BP: (!) 165/81 136/86  Pulse: 90 83  Resp: 18   Temp: 36.7 C   SpO2: 100%     Last Pain:  Vitals:   12/29/17 0619  TempSrc: Oral  PainSc:                  Clydene PughBeane, Jovanka Westgate D

## 2017-12-29 NOTE — Progress Notes (Signed)
LE 0000, pt sitting upright, attempting to get oob to restroom. RN in room to assess, tracing maternal HR.

## 2017-12-29 NOTE — Brief Op Note (Signed)
12/29/2017  4:03 AM  PATIENT:  Monica Combs  27 y.o. female  PRE-OPERATIVE DIAGNOSIS:  previous c/s Active labor POST-OPERATIVE DIAGNOSIS:  previous c/s Viable female  PROCEDURE:  Procedure(s): CESAREAN SECTION (N/A) LTCS SURGEON:  Surgeon(s) and Role:    * Glendine Swetz, Ihor Austinhomas J, MD - Primary  PHYSICIAN ASSISTANT: Genia DelMargaret Haviland, CNM  ASSISTANTS: none   ANESTHESIA:   spinal  EBL:  400 mL   BLOOD ADMINISTERED:none  DRAINS: Urinary Catheter (Foley)   LOCAL MEDICATIONS USED:  MARCAINE    and BUPIVICAINE   SPECIMEN:  No Specimen  DISPOSITION OF SPECIMEN:  N/A  COUNTS:  YES  TOURNIQUET:  * No tourniquets in log *  DICTATION: .Other Dictation: Dictation Number verbal  PLAN OF CARE: Admit to inpatient   PATIENT DISPOSITION:  PACU - hemodynamically stable.   Delay start of Pharmacological VTE agent (>24hrs) due to surgical blood loss or risk of bleeding: not applicable

## 2017-12-29 NOTE — Anesthesia Procedure Notes (Signed)
Spinal  Patient location during procedure: OR Start time: 12/29/2017 3:07 AM End time: 12/29/2017 3:11 AM Staffing Anesthesiologist: Emmie Niemann, MD Performed: anesthesiologist  Preanesthetic Checklist Completed: patient identified, site marked, surgical consent, pre-op evaluation, timeout performed, IV checked, risks and benefits discussed and monitors and equipment checked Spinal Block Patient position: sitting Prep: ChloraPrep Patient monitoring: heart rate, continuous pulse ox, blood pressure and cardiac monitor Approach: midline Location: L4-5 Injection technique: single-shot Needle Needle type: Introducer and Pencil-Tip  Needle gauge: 24 G Needle length: 9 cm Additional Notes Negative paresthesia. Negative blood return. Positive free-flowing CSF. Expiration date of kit checked and confirmed. Patient tolerated procedure well, without complications.

## 2017-12-29 NOTE — Progress Notes (Signed)
Lab staff at bedside for 2nd blood draw, explained last samples for T&S hemolysed. Awaiting lab results to proceed with repeat c-section.

## 2017-12-29 NOTE — Progress Notes (Signed)
0302 -Pt transferred to LD OR via birthing bed, ambulatory around table and sitting in position for Spinal placement. CRNA, Dr Priscella MannPenwarden and OR staff present.   0310 - FHT, 152  0315 - 38F foley cath placed following spinal placement.

## 2017-12-29 NOTE — Transfer of Care (Signed)
Immediate Anesthesia Transfer of Care Note  Patient: Monica Combs  Procedure(s) Performed: CESAREAN SECTION (N/A Abdomen)  Patient Location: PACU  Anesthesia Type:Spinal  Level of Consciousness: awake, alert , oriented and patient cooperative  Airway & Oxygen Therapy: Patient Spontanous Breathing and Patient connected to nasal cannula oxygen  Post-op Assessment: Report given to RN and Post -op Vital signs reviewed and stable  Post vital signs: Reviewed and stable  Last Vitals:  Vitals:   12/29/17 0110 12/29/17 0218  BP: 119/90 124/75  Pulse: (!) 107 65  Resp:    Temp:    SpO2:      Last Pain:  Vitals:   12/29/17 0039  TempSrc:   PainSc: 8          Complications: No apparent anesthesia complications

## 2017-12-29 NOTE — Anesthesia Post-op Follow-up Note (Signed)
Anesthesia QCDR form completed.        

## 2017-12-29 NOTE — Discharge Summary (Addendum)
Obstetrical Discharge Summary  Patient Name: Monica Combs DOB: 04-Mar-1991 MRN: [id number]  Date of Admission: 12/28/2017 Date of Delivery: 12/29/17 Delivered by: Feliberto Gottron , MD Date of Discharge: 12/31/17 Primary OB:  Phineas Real  WUJ:[id number]'W last menstrual period was 04/06/2017. EDC Estimated Date of Delivery: 01/11/18 Gestational Age at Delivery: [redacted]w[redacted]d   Antepartum complications:h/o bipolar / depression , no meds  Admitting Diagnosis: active labor . Elective repeat LTCS  Secondary Diagnosis: Patient Active Problem List   Diagnosis Date Noted  . Uterine contractions during pregnancy 12/29/2017  . Postoperative state 12/29/2017  . Decreased fetal movement 12/16/2017  . Hyperemesis 11/13/2017  . Pregnancy 09/21/2017    Complications: None Intrapartum complications: none Date of Delivery: 12/31/2017 Delivered By: Feliberto Gottron , MD Delivery Type: primary cesarean section, low transverse incision Anesthesia: spinal Placenta: manual  Laceration: none Episiotomy: none  Newborn Data:  female delivered at 0332 on 12/29/17   #6/6  Apgars 8/9   Postpartum Procedures: None  Post partum course:  Patient had an uncomplicated postpartum course.  By time of discharge on POD#2, her pain was controlled on oral pain medications; she had appropriate lochia and was ambulating, voiding without difficulty, tolerating regular diet and passing flatus.  She was deemed stable for discharge to home.    Discharge Physical Exam:  BP 123/73 (BP Location: Left Arm)   Pulse 95   Temp 98.6 F (37 C) (Oral)   Resp 20   Ht 5\' 2"  (1.575 m)   Wt 72.6 kg (160 lb)   LMP 04/06/2017   SpO2 100%   Breastfeeding? Unknown   BMI 29.26 kg/m   General: NAD CV: RRR Pulm: CTABL, nl effort ABD: s/nd/nt, fundus firm and below the umbilicus Lochia: moderate Incision: c/d/i DVT Evaluation: LE non-ttp, no evidence of DVT on exam.  Hemoglobin  Date Value Ref Range Status  12/29/2017 11.2 (L) 12.0 - 16.0 g/dL  Final   HCT  Date Value Ref Range Status  12/29/2017 33.8 (L) 35.0 - 47.0 % Final    Disposition: stable, discharge to home. Baby Feeding: breastmilk & formula Baby Disposition: home with mom  Rh Immune globulin given: n/a Rubella vaccine given: n/a Tdap vaccine given in AP or PP setting: 10/28/17 Flu vaccine given in AP or PP setting: needs PP  Contraception: TBD  Prenatal Labs:  ABO, Rh:  B+ Antibody:  neg  Rubella:  IMM/ varicella IMM RPR:   NR  HBsAg:   neg  HIV:   Neg  GBS:   Positive      Plan:  Amika Tassin was discharged to home in good condition. Follow-up appointment with delivering provider in 2 weeks.  Discharge Medications: Allergies as of 12/31/2017      Reactions   Latex Swelling   Other Hives, Other (See Comments)   Polyester      Medication List    TAKE these medications   acetaminophen 325 MG tablet Commonly known as:  TYLENOL Take 2 tablets (650 mg total) by mouth every 4 (four) hours as needed for mild pain or moderate pain.   ibuprofen 600 MG tablet Commonly known as:  ADVIL,MOTRIN Take 1 tablet (600 mg total) by mouth every 6 (six) hours as needed for mild pain, moderate pain or cramping.   oxyCODONE 5 MG immediate release tablet Commonly known as:  Oxy IR/ROXICODONE Take 1 tablet (5 mg total) by mouth every 6 (six) hours as needed for severe pain.   prenatal multivitamin Tabs tablet Take 1 tablet by mouth daily.  Follow-up Information    Schermerhorn, Ihor Austinhomas J, MD. Go on 01/13/2018.   Specialty:  Obstetrics and Gynecology Why:  Postpartum follow up appointment on Thursday March 21st at 3:00 PM for an incision check Contact information: [address] [place] [place] [address] [phone]           Signed: Genia DelMargaret Samyrah Bruster [facility] 12/31/2017 4:33 PM

## 2017-12-29 NOTE — Progress Notes (Signed)
Late Entry Pt arrived to [facility] via EMS, report received pt has been contracting q6-8 mins on ride to hospital, rates pain 7/10. Per pt, contractions started around 6p, irregular, then got closer and more painful around 10p, was unable to rest, called EMS. Hx previous c/s 4 yrs ago, scheduled for repeat on 01/05/18, confirms +FM, denies spotting, leaking or gush of fluid. GBS pos. Desires repeat c-section. Last ate 1900.

## 2017-12-30 ENCOUNTER — Encounter: Payer: Self-pay | Admitting: Obstetrics and Gynecology

## 2017-12-30 LAB — RPR: RPR: NONREACTIVE

## 2017-12-30 NOTE — Progress Notes (Signed)
Subjective: Postpartum Day 1: Cesarean Delivery Patient reports minor discomfort around her lower abd    Objective: Vital signs in last 24 hours: Temp:  [98 F (36.7 C)-99.1 F (37.3 C)] 98.4 F (36.9 C) (03/07 0010) Pulse Rate:  [58-73] 73 (03/07 0010) Resp:  [16-18] 18 (03/07 0010) BP: (109-122)/(47-69) 117/66 (03/07 0010) SpO2:  [99 %-100 %] 100 % (03/07 0010)  Physical Exam:  General: A,A& Ox3 Lochia: minimal, no clots Uterine Fundus: U-1, FF Incision:Dressing removed, Non-stick dressing intact over incision, C/D/I DVT Evaluation: Neg Homans  Recent Labs    12/29/17 0028 12/29/17 1015  HGB 12.1 11.2*  HCT 36.7 33.8*    Assessment/Plan: Status post Cesarean section. POD#1 Stable  P:1. Continue orders.  Sharee Pimplearon W Jones 12/30/2017, 9:15 AM

## 2017-12-30 NOTE — Lactation Note (Signed)
This note was copied from a baby's chart. Lactation Consultation Note  Patient Name: Monica Combs Monica Combs UXLKG'MToday's Date: 12/30/2017 Reason for consult: Follow-up assessment   Maternal Data Has patient been taught Hand Expression?: Yes  Feeding Feeding Type: Breast Fed  LATCH Score Latch: Repeated attempts needed to sustain latch, nipple held in mouth throughout feeding, stimulation needed to elicit sucking reflex.  Audible Swallowing: A few with stimulation  Type of Nipple: Everted at rest and after stimulation  Comfort (Breast/Nipple): Soft / non-tender  Hold (Positioning): Assistance needed to correctly position infant at breast and maintain latch.  LATCH Score: 7  Interventions Interventions: Assisted with latch;Breast feeding basics reviewed  Lactation Tools Discussed/Used WIC Program: Yes Pump Review: Milk Storage;Setup, frequency, and cleaning Initiated by:: Leroy Sea(Taylan Mayhan) Date initiated:: 12/30/17   Consult Status Consult Status: Follow-up  Mom plans to pump and bottle feed at home.  Burnadette PeterJaniya M Glorious Combs 12/30/2017, 5:05 PM

## 2017-12-31 ENCOUNTER — Ambulatory Visit: Payer: Self-pay

## 2017-12-31 MED ORDER — OXYCODONE HCL 5 MG PO TABS: 5.0000 mg | ORAL_TABLET | Freq: Four times a day (QID) | ORAL | 0 refills | Status: AC | PRN

## 2017-12-31 MED ORDER — IBUPROFEN 600 MG PO TABS: 600.0000 mg | ORAL_TABLET | Freq: Four times a day (QID) | ORAL | 0 refills | Status: AC | PRN

## 2017-12-31 MED ORDER — ACETAMINOPHEN 325 MG PO TABS: 650.0000 mg | ORAL_TABLET | ORAL | Status: AC | PRN

## 2017-12-31 MED ORDER — INFLUENZA VAC SPLIT QUAD 0.5 ML IM SUSY: 0.5000 mL | PREFILLED_SYRINGE | INTRAMUSCULAR | Status: DC

## 2017-12-31 MED ORDER — INFLUENZA VAC SPLIT QUAD 0.5 ML IM SUSY
0.5000 mL | PREFILLED_SYRINGE | Freq: Once | INTRAMUSCULAR | Status: AC
  Administered 2017-12-31: 0.5 mL via INTRAMUSCULAR
  Filled 2017-12-31: qty 0.5

## 2017-12-31 NOTE — Progress Notes (Signed)
ID bands checked. Patient discharged home with infant via wheelchair by nursing/auxillary.   Markham Dumlao Garner, RN  

## 2017-12-31 NOTE — Discharge Instructions (Signed)
Please call your doctor or return to the ER if you experience any chest pains, shortness of breath, dizziness, visual changes, fever greater than 101, any heavy bleeding (saturating more than 1 pad per hour), large clots, or foul smelling discharge, any worsening abdominal pain and cramping that is not controlled by pain medication, or any signs of postpartum depression. No tampons, enemas, douches, or sexual intercourse for 6 weeks. Also avoid tub baths, hot tubs, or swimming for 6 weeks.   Check your incision daily for any signs of infection such as redness, warmth, swelling, increased pain, or pus/foul smelling drainage  Activity: do not lift over 10 lbs for 6 weeks No driving for 2 weeks Pelvic rest for 6 weeks  After Your Delivery Discharge Instructions   Postpartum: Care Instructions  After childbirth (postpartum period), your body goes through many changes. Some of these changes happen over several weeks. In the hours after delivery, your body will begin to recover from childbirth while it prepares to breastfeed your newborn. You may feel emotional during this time. Your hormones can shift your mood without warning for no clear reason.  In the first couple of weeks after childbirth, many women have emotions that change from happy to sad. You may find it hard to sleep. You may cry a lot. This is called the "baby blues." These overwhelming emotions often go away within a couple of days or weeks. But it's important to discuss your feelings with your doctor.  You should call your care provider if you have unrelieved feelings of:  Inability to cope  Sadness  Anxiety  Lack of interest in baby  Insomnia  Crying  It is easy to get too tired and overwhelmed during the first weeks after childbirth. Don't try to do too much. Get rest whenever you can, accept help from others, and eat well and drink plenty of fluids.  About 4 to 6 weeks after your baby's birth, you will have a follow-up  visit with your care provider. This visit is your time to talk to your provider about anything you are concerned or curious about.  Follow-up care is a key part of your treatment and safety. Be sure to make and go to all appointments, and call your doctor if you are having problems. It's also a good idea to know your test results and keep a list of the medicines you take.  How can you care for yourself at home?  Sleep or rest when your baby sleeps.  Get help with household chores from family or friends, if you can. Do not try to do it all yourself.  If you have hemorrhoids or swelling or pain around the opening of your vagina, try using cold and heat. You can put ice or a cold pack on the area for 10 to 20 minutes at a time. Put a thin cloth between the ice and your skin. Also try sitting in a few inches of warm water (sitz bath) 3 times a day and after bowel movements.  Take pain medicines exactly as directed.  If the provider gave you a prescription medicine for pain, take it as prescribed.  If you do not have a prescription and need something over the counter, you can take:  Ibuprofen (Motrin, Advil) up to 600mg  every 6 hours as needed for pain  Acetaminophen (Tylenol) up to 650mg  every 4 hours as needed for pain  Some people find it helpful to alternate between these two medications.   No driving  for 1-2 weeks or while taking pain medications.   Eat more fiber to avoid constipation. Include foods such as whole-grain breads and cereals, raw vegetables, raw and dried fruits, and beans.  Drink plenty of fluids, enough so that your urine is light yellow or clear like water. If you have kidney, heart, or liver disease and have to limit fluids, talk with your doctor before you increase the amount of fluids you drink.  Do not put anything in the vagina for 6 weeks. This means no sex, no tampons, no douching, and no enemas.  If you have stitches, keep the area clean by pouring or spraying  warm water over the area outside your vagina and anus after you use the toilet.  No strenuous activity or heavy lifting for 6 weeks   No tub baths; showers only  Continue prenatal vitamin and iron.  If breastfeeding:  Increase calories and fluids while breastfeeding.  You may have a slight fever when your milk comes in, but it should go away on its own. If it does not, and rises above 101.0 please call the doctor.  For breastfeeding concerns, the lactation consultant can be reached at [phone].  For concerns about your baby, please call your pediatrician.   Keep a list of questions to bring to your postpartum visit. Your questions might be about:  Changes in your breasts, such as lumps or soreness.  When to expect your menstrual period to start again.  What form of birth control is best for you.  Weight you have put on during the pregnancy.  Exercise options.  What foods and drinks are best for you, especially if you are breastfeeding.  Problems you might be having with breastfeeding.  When you can have sex. Some women may want to talk about lubricants for the vagina.  Any feelings of sadness or restlessness that you are having.   When should you call for help?  Call 911 anytime you think you may need emergency care. For example, call if:  You have thoughts of harming yourself, your baby, or another person.  You passed out (lost consciousness).  Call the office at [phone] or seek immediate medical care if:  If you have heavy bleeding such that you are soaking 1 pad in an hour for 2 hours  You are dizzy or lightheaded, or you feel like you may faint.  You have a fever; a temperature of 101.0 F or greater  Chills  Difficulty urinating  Headache unrelieved by "pain meds"   Visual changes  Pain in the right side of your belly near your ribs  Breasts reddened, hard, hot to the touch or any other breast concerns  Nipple discharge which is  foul-smelling or contains pus   Increased pain at the site of the surgical incision    Incision drainage or problems  New pain unrelieved with recommended over-the-counter dosages  Difficulty breathing with or without chest pain   New leg pain, swelling, or redness, especially if it is only on one leg  Any other concerns  Watch closely for changes in your health, and be sure to contact your provider if:  You have new or worse vaginal discharge.  You feel sad or depressed.  You are having problems with your breasts or breastfeeding.

## 2017-12-31 NOTE — Lactation Note (Signed)
This note was copied from a baby's chart. Lactation Consultation Note  Patient Name: Monica Combs     Maternal Data  Mom has mostly been bottlefeeding, encouraged to attempt breastfeeding also, if she wants to breastfeed, baby can latch well to breast but sleepy at present, pt stopped breastfeeding with first because of nipple soreness, coconut oil given with instruction in use  [facility]                   Interventions    Lactation Tools Discussed/Used     Consult Status      Dyann KiefMarsha D Sequoia Mincey Combs, 6:51 PM

## 2017-12-31 NOTE — Progress Notes (Signed)
Discharge order received from doctor. Flu vaccine given prior to discharge. Reviewed discharge instructions and prescriptions with patient and answered all questions. Follow up appointment given. Incision cleaning kit given. Patient verbalized understanding. ID bands checked.    Hilbert Bible, RN

## 2018-01-04 ENCOUNTER — Other Ambulatory Visit: Payer: Medicaid Other

## 2018-01-05 ENCOUNTER — Inpatient Hospital Stay: Admit: 2018-01-05 | Payer: Self-pay | Admitting: Obstetrics and Gynecology

## 2018-01-05 SURGERY — Surgical Case
Anesthesia: Spinal

## 2018-03-27 IMAGING — US US OB COMP +14 WK
1 series · 14 of 28 positions shown · non-contrast
Comparison: none

CLINICAL DATA: Fetal anatomy scan.

EXAM:
OBSTETRICAL ULTRASOUND >14 WKS

[Series 1: us ob comp +14 wk · 0.19mm/px · 14 of 62 slices shown]
[im 3/62]
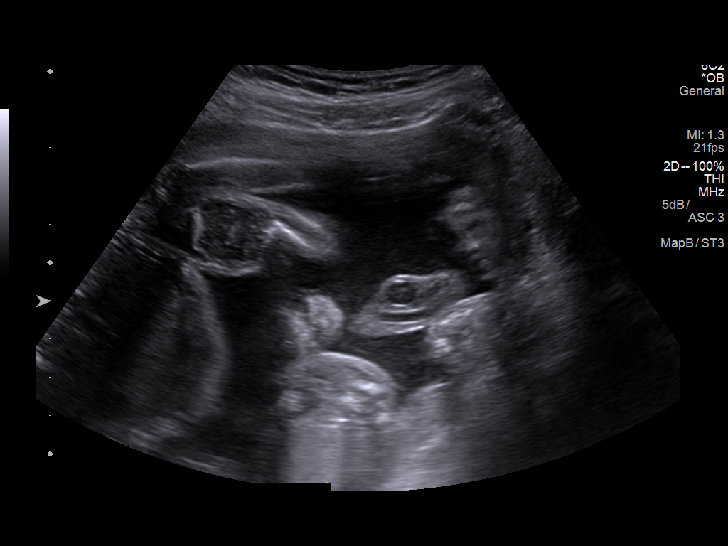
[im 7/62]
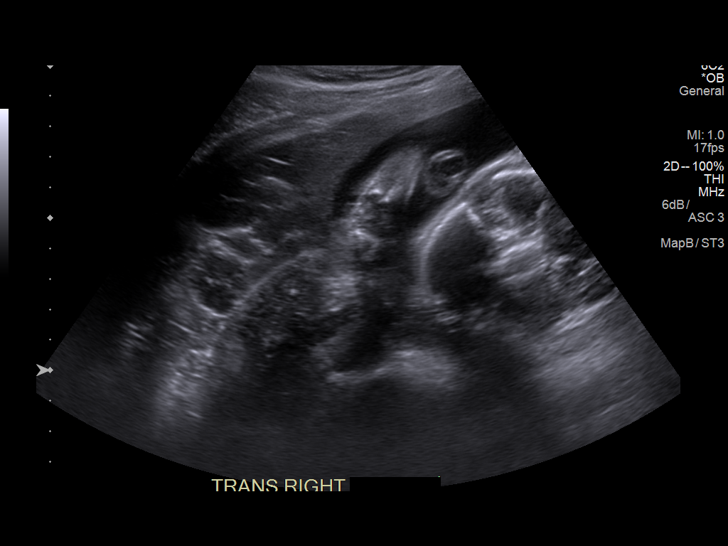
[im 12/62]
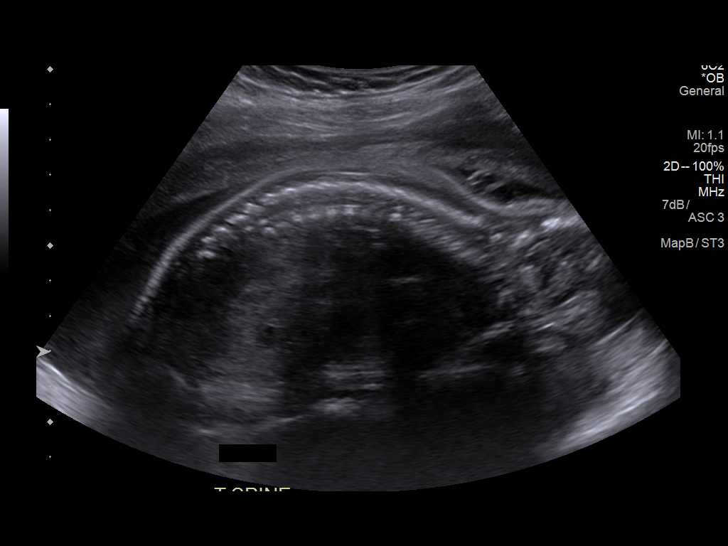
[im 16/62]
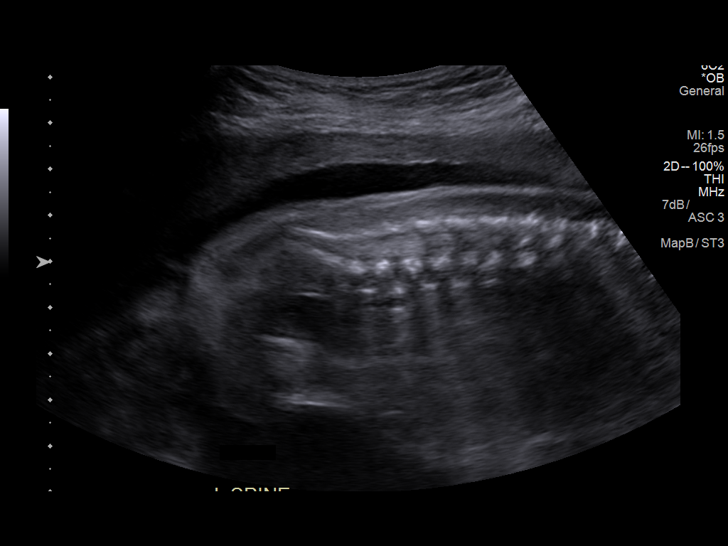
[im 21/62]
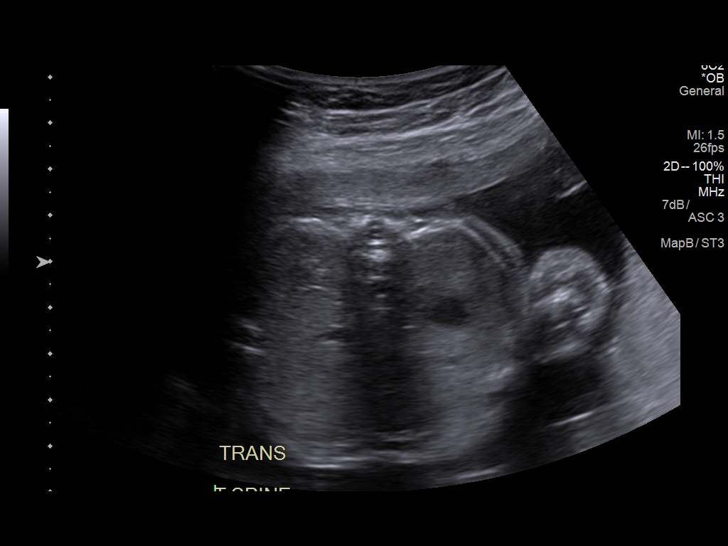
[im 25/62]
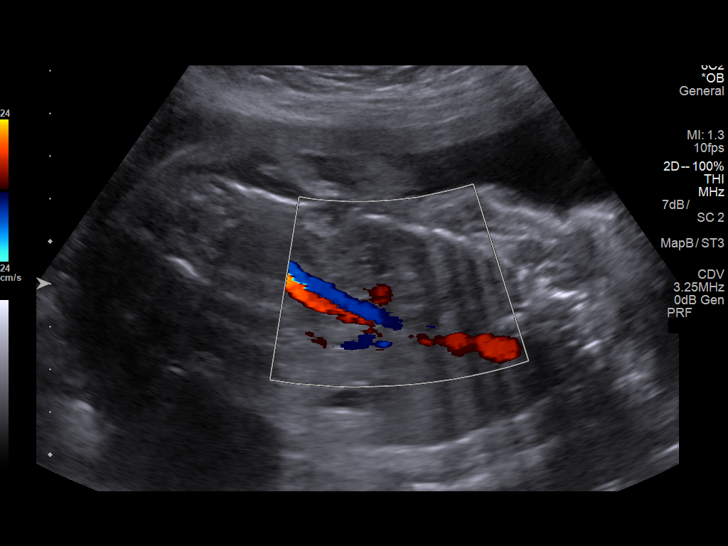
[im 30/62]
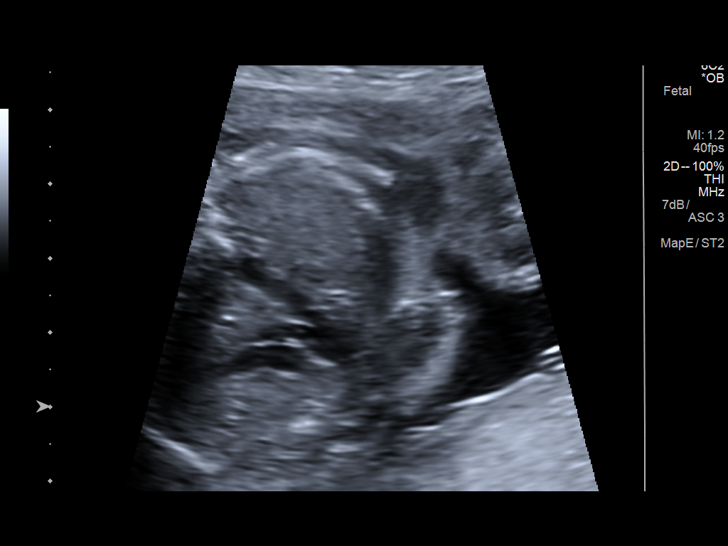
[im 34/62]
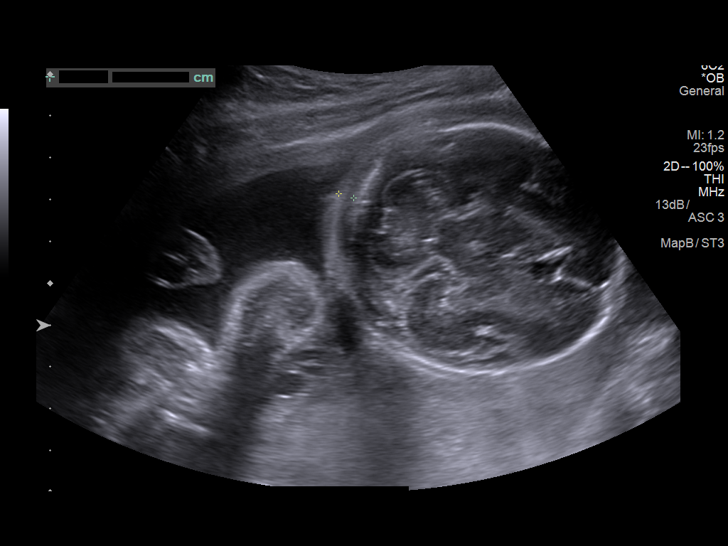
[im 39/62]
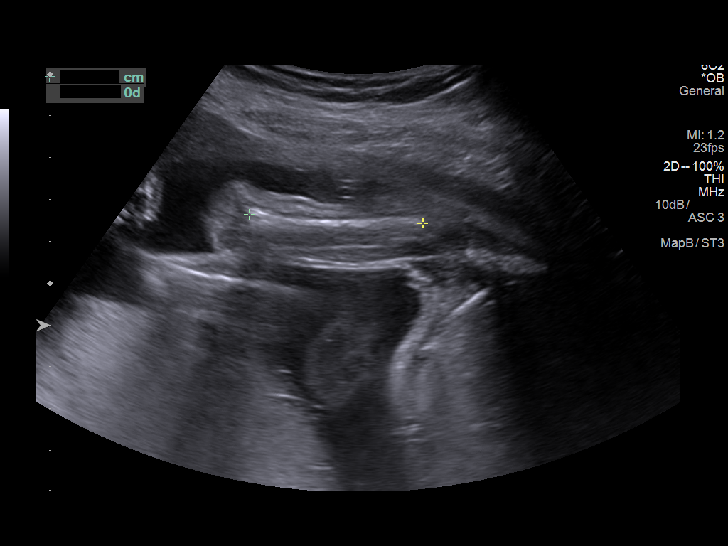
[im 43/62]
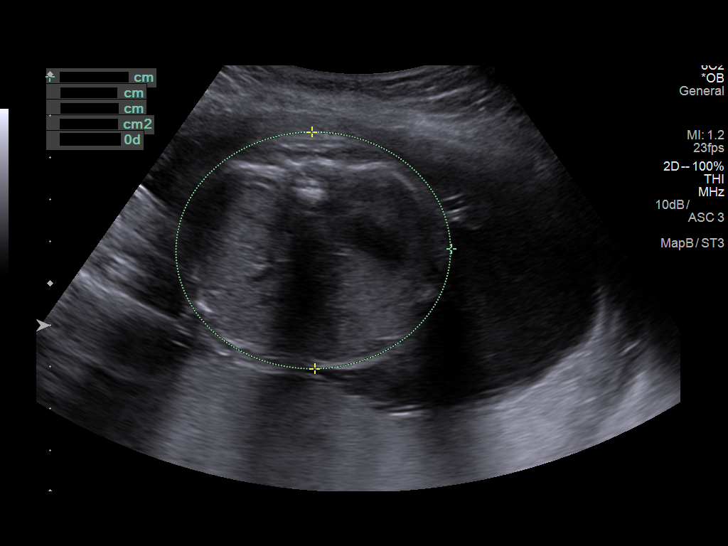
[im 48/62]
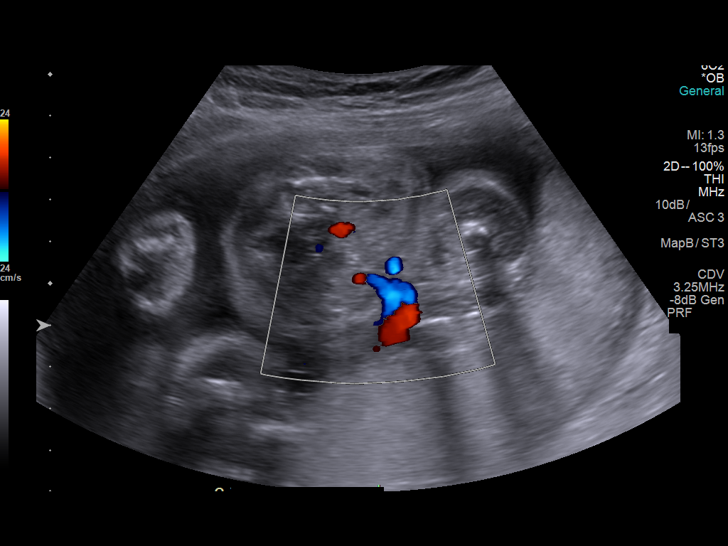
[im 52/62]
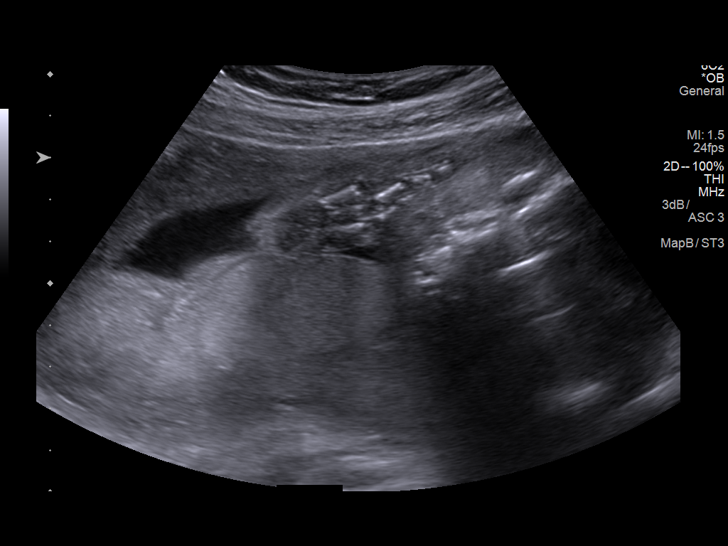
[im 57/62]
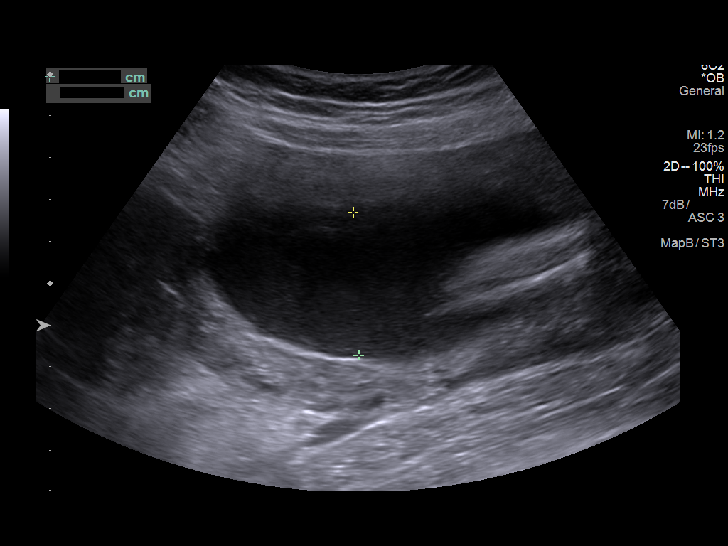
[im 62/62]
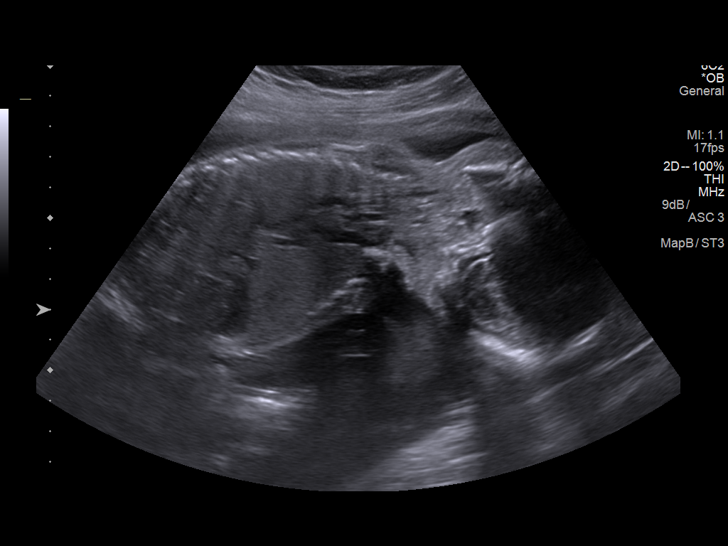

[14 of 28 positions shown; findings below may reference images not displayed]

FINDINGS: Number of Fetuses: 1

Heart Rate:  147 bpm

Movement: Present

Presentation: Cephalic

Previa: No

Placental Location: Posterior

Amniotic Fluid (Subjective): Normal

Amniotic Fluid (Objective):

Vertical pocket 4.6cm

FETAL BIOMETRY

BPD:  6.0cm 24w 2d

HC:    21.4cm  23w   3d

AC:   19.5cm  25w   3d

FL:   4.2cm  25w   1d

Current Mean GA: 24w 3d              US EDC: 01/11/2018

FETAL ANATOMY

Lateral Ventricles: Visualized

Thalami/CSP: Visualized

Posterior Fossa:  Visualized

Nuchal Region: Visualize    NFT= 2.7mm

Upper Lip: Visualized

Spine: Visualized

4 Chamber Heart on Left: Visualized

LVOT: Visualized

RVOT: Visualized

Stomach on Left: Visualized

3 Vessel Cord: Visualized

Cord Insertion site: Visualized

Kidneys: Visualized

Bladder: Visualized

Extremities: Visualized

Maternal Findings:

Cervix:  3.6 cm and closed.
IMPRESSION: Single viable intrauterine pregnancy at 24 weeks 3 days.

## 2018-04-24 IMAGING — US US ABDOMEN LIMITED
1 series · 4 of 4 positions shown · non-contrast
Comparison: None.

CLINICAL DATA: Grabbed from behind, with focal tenderness at the
left upper quadrant. The patient is 32 weeks pregnant.

EXAM:
ULTRASOUND ABDOMEN LIMITED

[Series 1: us abdomen limited · 0.04mm/px · 4 of 4 slices shown]
[im 1/4]
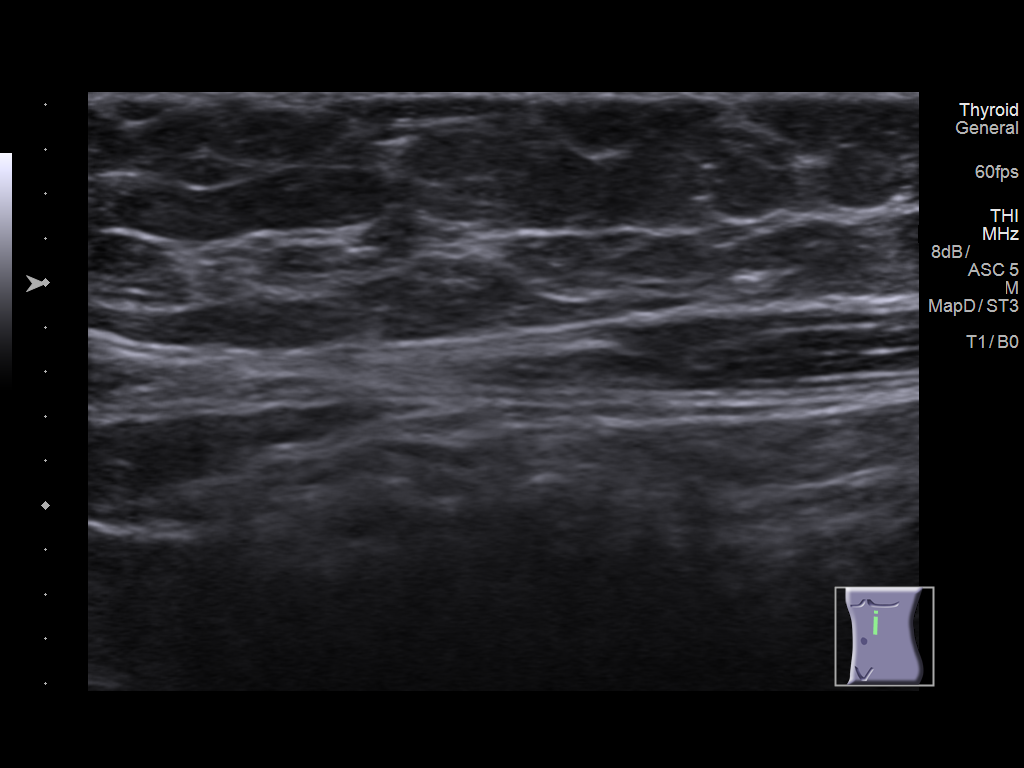
[im 2/4]
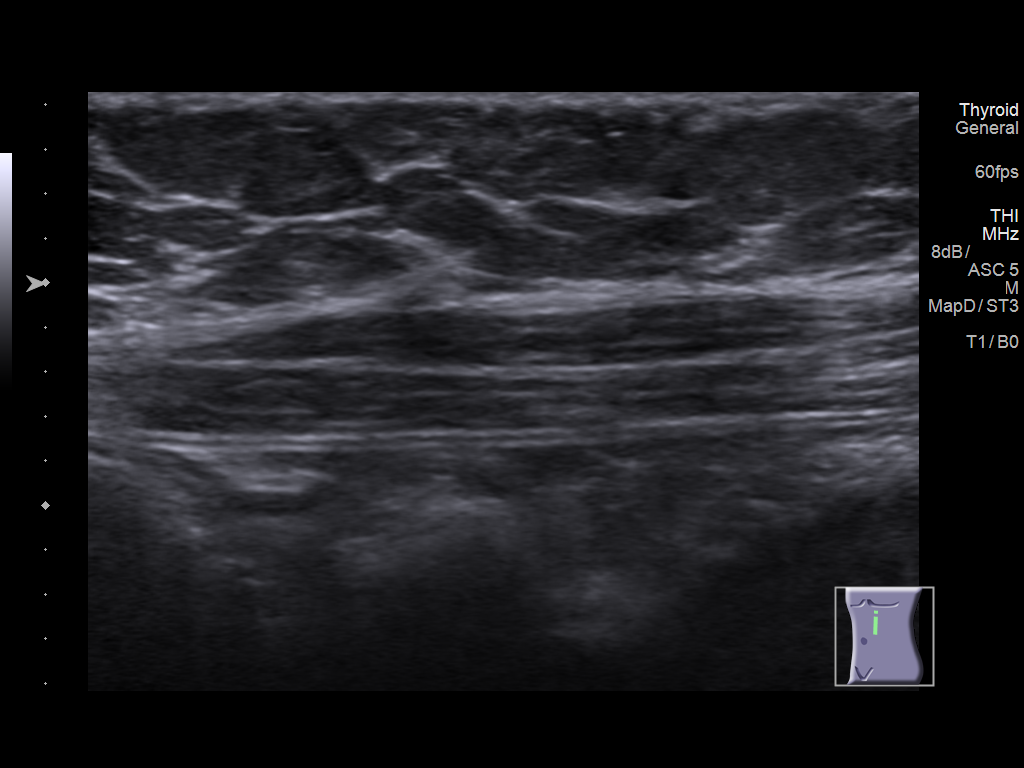
[im 3/4]
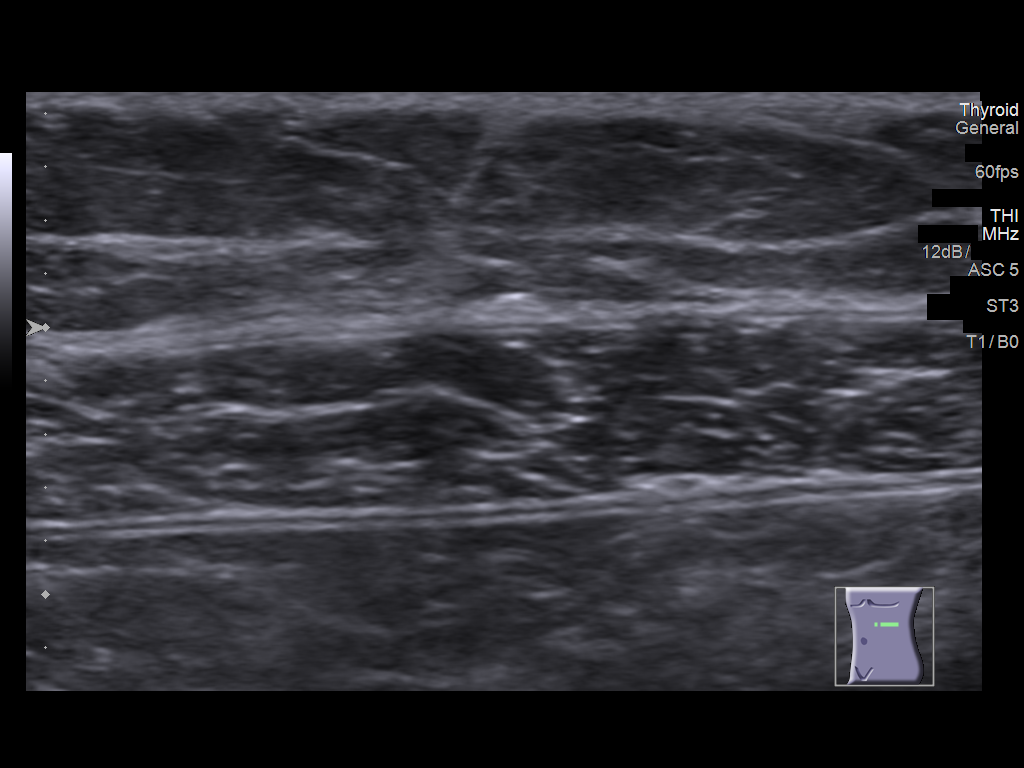
[im 4/4]
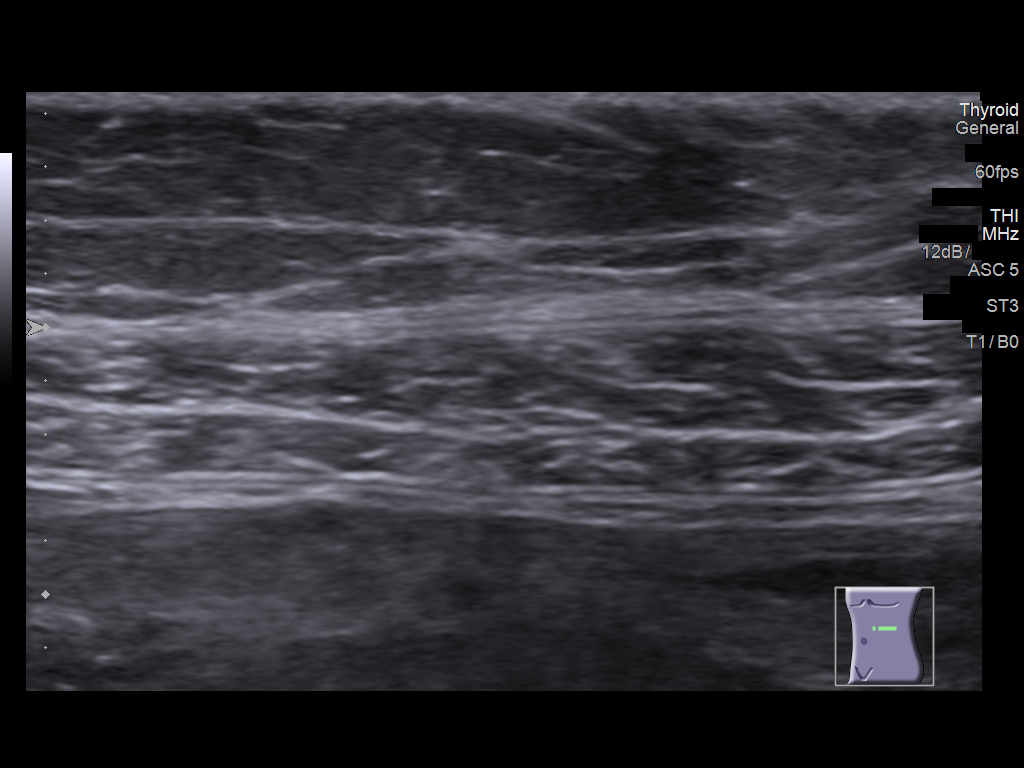

[4 of 4 positions shown; findings below may reference images not displayed]

FINDINGS: Evaluation of the left upper quadrant demonstrates no focal soft
tissue abnormality. There is no evidence of fluid collection.
IMPRESSION: No abnormality seen at the site of the patient's symptoms.
